# Patient Record
Sex: Female | Born: 1935 | Race: White | Hispanic: No | Smoking: Never smoker
Health system: Southern US, Community
[De-identification: ages and names within clinical notes are randomized; demographics above are authoritative.]

## PROBLEM LIST (undated history)

## (undated) DIAGNOSIS — M6289 Other specified disorders of muscle: Secondary | ICD-10-CM

## (undated) DIAGNOSIS — I10 Essential (primary) hypertension: Secondary | ICD-10-CM

## (undated) DIAGNOSIS — R339 Retention of urine, unspecified: Secondary | ICD-10-CM

## (undated) DIAGNOSIS — K219 Gastro-esophageal reflux disease without esophagitis: Secondary | ICD-10-CM

## (undated) DIAGNOSIS — L89610 Pressure ulcer of right heel, unstageable: Secondary | ICD-10-CM

## (undated) DIAGNOSIS — I2699 Other pulmonary embolism without acute cor pulmonale: Secondary | ICD-10-CM

## (undated) DIAGNOSIS — L89620 Pressure ulcer of left heel, unstageable: Secondary | ICD-10-CM

## (undated) DIAGNOSIS — F0390 Unspecified dementia without behavioral disturbance: Secondary | ICD-10-CM

## (undated) DIAGNOSIS — Z8719 Personal history of other diseases of the digestive system: Secondary | ICD-10-CM

## (undated) DIAGNOSIS — R51 Headache: Secondary | ICD-10-CM

## (undated) DIAGNOSIS — R519 Headache, unspecified: Secondary | ICD-10-CM

## (undated) DIAGNOSIS — F329 Major depressive disorder, single episode, unspecified: Secondary | ICD-10-CM

## (undated) DIAGNOSIS — F32A Depression, unspecified: Secondary | ICD-10-CM

## (undated) DIAGNOSIS — E43 Unspecified severe protein-calorie malnutrition: Secondary | ICD-10-CM

## (undated) DIAGNOSIS — F0391 Unspecified dementia with behavioral disturbance: Secondary | ICD-10-CM

## (undated) HISTORY — PX: CATARACT EXTRACTION: SUR2

---
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
## 1999-01-27 ENCOUNTER — Encounter: Payer: Self-pay | Admitting: General Surgery

## 1999-01-27 ENCOUNTER — Encounter: Payer: Self-pay | Admitting: *Deleted

## 1999-01-27 ENCOUNTER — Inpatient Hospital Stay (HOSPITAL_COMMUNITY): Admission: EM | Admit: 1999-01-27 | Discharge: 1999-02-01 | Payer: Self-pay | Admitting: *Deleted

## 2000-04-22 ENCOUNTER — Encounter: Admission: RE | Admit: 2000-04-22 | Discharge: 2000-04-22 | Payer: Self-pay | Admitting: Internal Medicine

## 2000-04-22 ENCOUNTER — Encounter: Payer: Self-pay | Admitting: Internal Medicine

## 2001-05-03 ENCOUNTER — Encounter: Admission: RE | Admit: 2001-05-03 | Discharge: 2001-05-03 | Payer: Self-pay | Admitting: Internal Medicine

## 2001-05-03 ENCOUNTER — Encounter: Payer: Self-pay | Admitting: Internal Medicine

## 2002-05-24 ENCOUNTER — Encounter: Payer: Self-pay | Admitting: Internal Medicine

## 2002-05-24 ENCOUNTER — Encounter: Admission: RE | Admit: 2002-05-24 | Discharge: 2002-05-24 | Payer: Self-pay | Admitting: Internal Medicine

## 2004-06-19 ENCOUNTER — Encounter: Admission: RE | Admit: 2004-06-19 | Discharge: 2004-06-19 | Payer: Self-pay | Admitting: Internal Medicine

## 2006-04-06 ENCOUNTER — Encounter: Admission: RE | Admit: 2006-04-06 | Discharge: 2006-04-06 | Payer: Self-pay | Admitting: Internal Medicine

## 2010-08-15 ENCOUNTER — Encounter: Payer: Self-pay | Admitting: Internal Medicine

## 2010-08-16 ENCOUNTER — Encounter: Payer: Self-pay | Admitting: Internal Medicine

## 2013-05-07 ENCOUNTER — Ambulatory Visit: Payer: Self-pay | Admitting: Podiatry

## 2013-06-04 ENCOUNTER — Ambulatory Visit (INDEPENDENT_AMBULATORY_CARE_PROVIDER_SITE_OTHER): Payer: Medicare HMO | Admitting: Podiatry

## 2013-06-04 ENCOUNTER — Encounter: Payer: Self-pay | Admitting: Podiatry

## 2013-06-04 VITALS — BP 101/86 | HR 86 | Resp 12

## 2013-06-04 DIAGNOSIS — B351 Tinea unguium: Secondary | ICD-10-CM

## 2013-06-04 DIAGNOSIS — M79609 Pain in unspecified limb: Secondary | ICD-10-CM

## 2013-06-04 NOTE — Progress Notes (Signed)
Patient ID: Elizabeth Booth, female   DOB: 1923/07/20, 79 y.o.   MRN: [id number]  Subjective: Alert orientated x3 white female presents for ongoing debridement of painful mycotic toenails and keratoses.  Objective: Yellow, brittle, hypertrophic toenails with texture and color changes x10. The nail plates are tender to palpation. Nucleated keratoses plantar second right MPJ noted.  Assessment: Symptomatic onychomycoses x10 Porokeratoses x1  Plan: Nails x10 debrided back keratoses x1 debrided back without any bleeding. Reappoint at three-month intervals.  Elizabeth Booth C.Leeanne Deed, DPM

## 2013-08-27 ENCOUNTER — Encounter: Payer: Self-pay | Admitting: Podiatry

## 2013-08-27 ENCOUNTER — Ambulatory Visit (INDEPENDENT_AMBULATORY_CARE_PROVIDER_SITE_OTHER): Payer: Medicare HMO | Admitting: Podiatry

## 2013-08-27 VITALS — BP 116/66 | HR 88 | Resp 18

## 2013-08-27 DIAGNOSIS — B351 Tinea unguium: Secondary | ICD-10-CM

## 2013-08-27 DIAGNOSIS — M79609 Pain in unspecified limb: Secondary | ICD-10-CM

## 2013-08-27 NOTE — Patient Instructions (Signed)
Removed Band-Aid on second right toe in 24 hours. If area still remains tender apply topical antibiotic ointment and Band-Aid daily until pain free.

## 2013-08-27 NOTE — Progress Notes (Signed)
° °  Subjective:    Patient ID: Elizabeth HabermannHelen S Goya, female    DOB: 03/21/1923, 80 y.o.   MRN: [id number]  HPI I am getting my toenails cut today    Review of Systems     Objective:   Physical Exam Dermatological: Elongated, hypertrophic, brittle toenails x10 noted.       Assessment & Plan:   Assessment: Symptomatic onychomycoses x10  Plan: Debridement of toenails x10 with slight pinpoint bleeding noted the second right toe. Patient advised to apply topical antibiotic ointment to the second right toe area and cover with a Band-Aid daily until healed.  Reappoint at three-month intervals or sooner if patient has concern.

## 2013-11-23 ENCOUNTER — Ambulatory Visit
Admission: RE | Admit: 2013-11-23 | Discharge: 2013-11-23 | Disposition: A | Discharge status: Home / Self Care | Payer: Commercial Managed Care - HMO | Source: Ambulatory Visit | Attending: Nurse Practitioner | Admitting: Nurse Practitioner

## 2013-11-23 ENCOUNTER — Other Ambulatory Visit: Payer: Self-pay | Admitting: Nurse Practitioner

## 2013-11-23 DIAGNOSIS — S93409A Sprain of unspecified ligament of unspecified ankle, initial encounter: Secondary | ICD-10-CM

## 2013-11-26 ENCOUNTER — Ambulatory Visit: Payer: Medicare HMO | Admitting: Podiatry

## 2013-12-24 ENCOUNTER — Ambulatory Visit (INDEPENDENT_AMBULATORY_CARE_PROVIDER_SITE_OTHER): Payer: Medicare HMO | Admitting: Podiatry

## 2013-12-24 ENCOUNTER — Encounter: Payer: Self-pay | Admitting: Podiatry

## 2013-12-24 VITALS — BP 103/64 | HR 87 | Resp 18

## 2013-12-24 DIAGNOSIS — M79609 Pain in unspecified limb: Secondary | ICD-10-CM

## 2013-12-24 DIAGNOSIS — B351 Tinea unguium: Secondary | ICD-10-CM

## 2013-12-25 NOTE — Progress Notes (Signed)
Patient ID: Elizabeth Booth, female   DOB: 1922-10-11, 80 y.o.   MRN: [id number]  Subjective: White female presents for ongoing debridement of painful toenails  Objective: Hypertrophic, brittle, elongated, discolored toenails x10  Assessment: Symptomatic onychomycoses x10  Plan: Debridement of toenails x10 without a bleeding  Reappoint x3 months

## 2014-03-25 ENCOUNTER — Ambulatory Visit (INDEPENDENT_AMBULATORY_CARE_PROVIDER_SITE_OTHER): Payer: Medicare HMO | Admitting: Podiatry

## 2014-03-25 DIAGNOSIS — M79609 Pain in unspecified limb: Secondary | ICD-10-CM

## 2014-03-25 DIAGNOSIS — M79676 Pain in unspecified toe(s): Secondary | ICD-10-CM

## 2014-03-25 DIAGNOSIS — B351 Tinea unguium: Secondary | ICD-10-CM

## 2014-03-25 NOTE — Progress Notes (Signed)
Patient ID: Elizabeth Booth, female   DOB: 15-Oct-1922, 80 y.o.   MRN: [id number]  Subjective: This patient presents complaining of painful toenails  Objective: Elongated, incurvated, brittle, discolored toenails 6-10  Assessment: Symptomatic onychomycoses 6-10  Plan: Nails x10 are debrided without any bleeding  Reappoint x3 months:

## 2014-06-24 ENCOUNTER — Ambulatory Visit: Payer: Medicare HMO | Admitting: Podiatry

## 2014-07-01 ENCOUNTER — Ambulatory Visit (INDEPENDENT_AMBULATORY_CARE_PROVIDER_SITE_OTHER): Payer: Medicare HMO | Admitting: Podiatry

## 2014-07-01 ENCOUNTER — Encounter: Payer: Self-pay | Admitting: Podiatry

## 2014-07-01 DIAGNOSIS — M79676 Pain in unspecified toe(s): Secondary | ICD-10-CM

## 2014-07-01 DIAGNOSIS — B351 Tinea unguium: Secondary | ICD-10-CM

## 2014-07-02 NOTE — Progress Notes (Signed)
Patient ID: Bridgette HabermannHelen S Worm, female   DOB: 03/12/1923, 80 y.o.   MRN: [id number]  Subjective: This patient presents complaining of painful toenails  Objective: The toenails are discolored, incurvated, brittle, 6-10  Assessment: Symptomatic onychomycoses 6-10  Plan: Debrided toenails 10 without a bleeding  Reappoint 3 months

## 2014-09-30 ENCOUNTER — Encounter: Payer: Self-pay | Admitting: Podiatry

## 2014-09-30 ENCOUNTER — Ambulatory Visit (INDEPENDENT_AMBULATORY_CARE_PROVIDER_SITE_OTHER): Payer: Commercial Managed Care - HMO | Admitting: Podiatry

## 2014-09-30 DIAGNOSIS — M79676 Pain in unspecified toe(s): Secondary | ICD-10-CM

## 2014-09-30 DIAGNOSIS — B351 Tinea unguium: Secondary | ICD-10-CM

## 2014-10-01 NOTE — Progress Notes (Signed)
Patient ID: Elizabeth HabermannHelen S Booth, female   DOB: 09/15/1922, 81 y.o.   MRN: [id number]  Subjective: Patient presents today complaining of toenails and requests debridement  Objective: The toenails are incurvated, discolored, brittle and tender to palpation 6-10  Assessment: Symptomatic onychomycoses 6-10  Plan: Debridement of toenails 10 without any bleeding  Reappoint 3 months

## 2015-01-14 ENCOUNTER — Ambulatory Visit: Payer: Commercial Managed Care - HMO | Admitting: Podiatry

## 2015-01-15 ENCOUNTER — Ambulatory Visit (INDEPENDENT_AMBULATORY_CARE_PROVIDER_SITE_OTHER): Payer: Commercial Managed Care - HMO | Admitting: Podiatry

## 2015-01-15 ENCOUNTER — Encounter: Payer: Self-pay | Admitting: Podiatry

## 2015-01-15 DIAGNOSIS — M79676 Pain in unspecified toe(s): Secondary | ICD-10-CM | POA: Diagnosis not present

## 2015-01-15 DIAGNOSIS — B351 Tinea unguium: Secondary | ICD-10-CM

## 2015-01-16 NOTE — Progress Notes (Signed)
Patient ID: Elizabeth Booth, female   DOB: Apr 23, 1923, 81 y.o.   MRN: [id number]  Subjective: Patient presents today complaining of painful toenails and requests toenail debridement  Objective: The toenails are elongated, brittle, incurvated, discolored and tender to direct palpation 6-10  Assessment:  symptomatic onychomycoses 6-10  Plan: Debridement toenails 10 without any bleeding  Reappoint 3 months

## 2015-04-16 ENCOUNTER — Encounter: Payer: Self-pay | Admitting: Podiatry

## 2015-04-16 ENCOUNTER — Ambulatory Visit (INDEPENDENT_AMBULATORY_CARE_PROVIDER_SITE_OTHER): Payer: Commercial Managed Care - HMO | Admitting: Podiatry

## 2015-04-16 DIAGNOSIS — B351 Tinea unguium: Secondary | ICD-10-CM

## 2015-04-16 DIAGNOSIS — M79676 Pain in unspecified toe(s): Secondary | ICD-10-CM | POA: Diagnosis not present

## 2015-04-16 NOTE — Progress Notes (Signed)
Patient ID: Elizabeth Booth, female   DOB: [birth year], 81 y.o.   MRN: [id number]  Subjective: This patient presents for a scheduled visit complaining of painful toenails  Objective: Orientated 3 The toenails are hypertrophic, incurvated, discolored and tender to direct palpation 6-10  Assessment: Symptomatic onychomycoses 6-10  Plan: Debridement toenails 10 and mechanically and electrically without any bleeding  Reappoint 3 months

## 2015-07-23 ENCOUNTER — Ambulatory Visit (INDEPENDENT_AMBULATORY_CARE_PROVIDER_SITE_OTHER): Payer: Commercial Managed Care - HMO | Admitting: Podiatry

## 2015-07-23 ENCOUNTER — Encounter: Payer: Self-pay | Admitting: Podiatry

## 2015-07-23 DIAGNOSIS — B351 Tinea unguium: Secondary | ICD-10-CM | POA: Diagnosis not present

## 2015-07-23 DIAGNOSIS — M79676 Pain in unspecified toe(s): Secondary | ICD-10-CM

## 2015-07-24 NOTE — Progress Notes (Signed)
Patient ID: Elizabeth HabermannHelen S Welchel, female   DOB: 01/31/1923, 81 y.o.   MRN: [id number]  Subjective: This patient presents again for a scheduled visit complaining that her her toenails are thick and elongated and are uncomfortable walking wearing shoes and she request toenail debridement  Objective: Orientated 3 No open skin lesions bilaterally The toenails are brittle, he elongated, discolored, hypertrophic and tender direct palpation 6-10  Assessment: Symptomatic onychomycoses 6-10  Plan: Debridement toenails 6-10 mechanically and electronically without any bleeding  Reappoint 3 month

## 2015-10-29 ENCOUNTER — Ambulatory Visit (INDEPENDENT_AMBULATORY_CARE_PROVIDER_SITE_OTHER): Payer: PPO | Admitting: Podiatry

## 2015-10-29 ENCOUNTER — Encounter: Payer: Self-pay | Admitting: Podiatry

## 2015-10-29 DIAGNOSIS — M79676 Pain in unspecified toe(s): Secondary | ICD-10-CM | POA: Diagnosis not present

## 2015-10-29 DIAGNOSIS — B351 Tinea unguium: Secondary | ICD-10-CM | POA: Diagnosis not present

## 2015-10-30 NOTE — Progress Notes (Signed)
Patient ID: Elizabeth HabermannHelen S Stenner, female   DOB: 12/07/1922, 82 y.o.   MRN: [id number]  Subjective: This patient presents again for a scheduled visit complaining that her her toenails are thick and elongated and are uncomfortable walking wearing shoes and she request toenail debridement  Objective: Orientated 3 No open skin lesions bilaterally The toenails are brittle, he elongated, discolored, hypertrophic and tender direct palpation 6-10  Assessment: Symptomatic onychomycoses 6-10  Plan: Debridement toenails 6-10 mechanically and electronically without any bleeding  Reappoint 3 month

## 2015-12-29 ENCOUNTER — Observation Stay (HOSPITAL_COMMUNITY)
Admission: EM | Admit: 2015-12-29 | Discharge: 2016-01-02 | Disposition: A | Discharge status: Home Health | Payer: PPO | Attending: Internal Medicine | Admitting: Internal Medicine

## 2015-12-29 ENCOUNTER — Emergency Department (HOSPITAL_COMMUNITY): Payer: PPO

## 2015-12-29 ENCOUNTER — Encounter (HOSPITAL_COMMUNITY): Payer: Self-pay | Admitting: Physician Assistant

## 2015-12-29 ENCOUNTER — Observation Stay (HOSPITAL_COMMUNITY): Payer: PPO

## 2015-12-29 DIAGNOSIS — K922 Gastrointestinal hemorrhage, unspecified: Secondary | ICD-10-CM | POA: Diagnosis not present

## 2015-12-29 DIAGNOSIS — Z7189 Other specified counseling: Secondary | ICD-10-CM | POA: Insufficient documentation

## 2015-12-29 DIAGNOSIS — I10 Essential (primary) hypertension: Secondary | ICD-10-CM | POA: Diagnosis present

## 2015-12-29 DIAGNOSIS — G309 Alzheimer's disease, unspecified: Secondary | ICD-10-CM | POA: Insufficient documentation

## 2015-12-29 DIAGNOSIS — Z880 Allergy status to penicillin: Secondary | ICD-10-CM | POA: Insufficient documentation

## 2015-12-29 DIAGNOSIS — K921 Melena: Principal | ICD-10-CM | POA: Diagnosis present

## 2015-12-29 DIAGNOSIS — N179 Acute kidney failure, unspecified: Secondary | ICD-10-CM | POA: Diagnosis not present

## 2015-12-29 DIAGNOSIS — M25473 Effusion, unspecified ankle: Secondary | ICD-10-CM

## 2015-12-29 DIAGNOSIS — F0391 Unspecified dementia with behavioral disturbance: Secondary | ICD-10-CM | POA: Diagnosis not present

## 2015-12-29 DIAGNOSIS — M25461 Effusion, right knee: Secondary | ICD-10-CM | POA: Diagnosis not present

## 2015-12-29 DIAGNOSIS — G4734 Idiopathic sleep related nonobstructive alveolar hypoventilation: Secondary | ICD-10-CM

## 2015-12-29 DIAGNOSIS — F0280 Dementia in other diseases classified elsewhere without behavioral disturbance: Secondary | ICD-10-CM | POA: Insufficient documentation

## 2015-12-29 DIAGNOSIS — E875 Hyperkalemia: Secondary | ICD-10-CM | POA: Diagnosis not present

## 2015-12-29 DIAGNOSIS — F0390 Unspecified dementia without behavioral disturbance: Secondary | ICD-10-CM | POA: Insufficient documentation

## 2015-12-29 DIAGNOSIS — R0902 Hypoxemia: Secondary | ICD-10-CM | POA: Diagnosis not present

## 2015-12-29 DIAGNOSIS — E871 Hypo-osmolality and hyponatremia: Secondary | ICD-10-CM | POA: Insufficient documentation

## 2015-12-29 DIAGNOSIS — E860 Dehydration: Secondary | ICD-10-CM | POA: Diagnosis not present

## 2015-12-29 DIAGNOSIS — Z515 Encounter for palliative care: Secondary | ICD-10-CM | POA: Insufficient documentation

## 2015-12-29 DIAGNOSIS — Z66 Do not resuscitate: Secondary | ICD-10-CM | POA: Diagnosis not present

## 2015-12-29 HISTORY — DX: Essential (primary) hypertension: I10

## 2015-12-29 HISTORY — DX: Unspecified dementia, unspecified severity, without behavioral disturbance, psychotic disturbance, mood disturbance, and anxiety: F03.90

## 2015-12-29 LAB — ABO/RH: ABO/RH(D): O POS

## 2015-12-29 LAB — I-STAT CHEM 8, ED
BUN: 34 mg/dL — ABNORMAL HIGH (ref 6–20)
CALCIUM ION: 1.07 mmol/L — AB (ref 1.13–1.30)
CREATININE: 1 mg/dL (ref 0.44–1.00)
Chloride: 89 mmol/L — ABNORMAL LOW (ref 101–111)
GLUCOSE: 143 mg/dL — AB (ref 65–99)
HEMATOCRIT: 39 % (ref 36.0–46.0)
HEMOGLOBIN: 13.3 g/dL (ref 12.0–15.0)
Potassium: 4.6 mmol/L (ref 3.5–5.1)
Sodium: 129 mmol/L — ABNORMAL LOW (ref 135–145)
TCO2: 32 mmol/L (ref 0–100)

## 2015-12-29 LAB — CBC WITH DIFFERENTIAL/PLATELET
BASOS ABS: 0 10*3/uL (ref 0.0–0.1)
Basophils Relative: 0 %
Eosinophils Absolute: 0.1 10*3/uL (ref 0.0–0.7)
Eosinophils Relative: 1 %
HCT: 38.3 % (ref 36.0–46.0)
HEMOGLOBIN: 12.2 g/dL (ref 12.0–15.0)
LYMPHS PCT: 5 %
Lymphs Abs: 0.6 10*3/uL — ABNORMAL LOW (ref 0.7–4.0)
MCH: 29.6 pg (ref 26.0–34.0)
MCHC: 31.9 g/dL (ref 30.0–36.0)
MCV: 93 fL (ref 78.0–100.0)
Monocytes Absolute: 1.4 10*3/uL — ABNORMAL HIGH (ref 0.1–1.0)
Monocytes Relative: 12 %
NEUTROS PCT: 82 %
Neutro Abs: 9.4 10*3/uL — ABNORMAL HIGH (ref 1.7–7.7)
Platelets: 207 10*3/uL (ref 150–400)
RBC: 4.12 MIL/uL (ref 3.87–5.11)
RDW: 13.1 % (ref 11.5–15.5)
WBC: 11.6 10*3/uL — AB (ref 4.0–10.5)

## 2015-12-29 LAB — COMPREHENSIVE METABOLIC PANEL
ALT: 21 U/L (ref 14–54)
ANION GAP: 7 (ref 5–15)
AST: 32 U/L (ref 15–41)
Albumin: 2.9 g/dL — ABNORMAL LOW (ref 3.5–5.0)
Alkaline Phosphatase: 64 U/L (ref 38–126)
BUN: 32 mg/dL — ABNORMAL HIGH (ref 6–20)
CHLORIDE: 93 mmol/L — AB (ref 101–111)
CO2: 28 mmol/L (ref 22–32)
Calcium: 8.3 mg/dL — ABNORMAL LOW (ref 8.9–10.3)
Creatinine, Ser: 1.06 mg/dL — ABNORMAL HIGH (ref 0.44–1.00)
GFR, EST AFRICAN AMERICAN: 52 mL/min — AB (ref >60)
GFR, EST NON AFRICAN AMERICAN: 44 mL/min — AB (ref >60)
Glucose, Bld: 146 mg/dL — ABNORMAL HIGH (ref 65–99)
POTASSIUM: 4.6 mmol/L (ref 3.5–5.1)
Sodium: 128 mmol/L — ABNORMAL LOW (ref 135–145)
Total Bilirubin: 1.5 mg/dL — ABNORMAL HIGH (ref 0.3–1.2)
Total Protein: 6.3 g/dL — ABNORMAL LOW (ref 6.5–8.1)

## 2015-12-29 LAB — POC OCCULT BLOOD, ED
FECAL OCCULT BLD: NEGATIVE
Fecal Occult Bld: POSITIVE — AB

## 2015-12-29 LAB — URINALYSIS, ROUTINE W REFLEX MICROSCOPIC
Bilirubin Urine: NEGATIVE
GLUCOSE, UA: NEGATIVE mg/dL
HGB URINE DIPSTICK: NEGATIVE
Ketones, ur: 15 mg/dL — AB
Leukocytes, UA: NEGATIVE
Nitrite: NEGATIVE
Protein, ur: NEGATIVE mg/dL
SPECIFIC GRAVITY, URINE: 1.022 (ref 1.005–1.030)
pH: 6.5 (ref 5.0–8.0)

## 2015-12-29 LAB — CBG MONITORING, ED: Glucose-Capillary: 160 mg/dL — ABNORMAL HIGH (ref 65–99)

## 2015-12-29 LAB — TYPE AND SCREEN
ABO/RH(D): O POS
Antibody Screen: NEGATIVE

## 2015-12-29 LAB — PROTIME-INR
INR: 1.29 (ref 0.00–1.49)
Prothrombin Time: 16.2 seconds — ABNORMAL HIGH (ref 11.6–15.2)

## 2015-12-29 LAB — APTT: APTT: 31 s (ref 24–37)

## 2015-12-29 MED ORDER — SODIUM CHLORIDE 0.9 % IV SOLN
INTRAVENOUS | Status: DC
  Administered 2015-12-29 – 2016-01-01 (×2): via INTRAVENOUS

## 2015-12-29 MED ORDER — ONDANSETRON HCL 4 MG/2ML IJ SOLN
4.0000 mg | Freq: Once | INTRAMUSCULAR | Status: AC
  Administered 2015-12-29: 4 mg via INTRAVENOUS
  Filled 2015-12-29: qty 2

## 2015-12-29 MED ORDER — LOSARTAN POTASSIUM 50 MG PO TABS
50.0000 mg | ORAL_TABLET | Freq: Every day | ORAL | Status: DC
  Administered 2015-12-30 – 2015-12-31 (×2): 50 mg via ORAL
  Filled 2015-12-29 (×3): qty 1

## 2015-12-29 MED ORDER — SODIUM CHLORIDE 0.9 % IV SOLN
8.0000 mg/h | INTRAVENOUS | Status: DC
  Administered 2015-12-29 – 2016-01-01 (×5): 8 mg/h via INTRAVENOUS
  Filled 2015-12-29 (×7): qty 80

## 2015-12-29 MED ORDER — SODIUM CHLORIDE 0.9% FLUSH
3.0000 mL | Freq: Two times a day (BID) | INTRAVENOUS | Status: DC
  Administered 2015-12-30 – 2016-01-02 (×4): 3 mL via INTRAVENOUS

## 2015-12-29 MED ORDER — HALOPERIDOL LACTATE 5 MG/ML IJ SOLN
5.0000 mg | Freq: Four times a day (QID) | INTRAMUSCULAR | Status: DC | PRN
  Administered 2015-12-29: 5 mg via INTRAMUSCULAR
  Filled 2015-12-29: qty 1

## 2015-12-29 MED ORDER — SODIUM CHLORIDE 0.9 % IV SOLN
80.0000 mg | Freq: Once | INTRAVENOUS | Status: AC
  Administered 2015-12-29: 80 mg via INTRAVENOUS
  Filled 2015-12-29: qty 80

## 2015-12-29 MED ORDER — ACETAMINOPHEN 325 MG PO TABS
650.0000 mg | ORAL_TABLET | Freq: Four times a day (QID) | ORAL | Status: DC | PRN
  Filled 2015-12-29: qty 2

## 2015-12-29 MED ORDER — LORAZEPAM 2 MG/ML IJ SOLN
0.5000 mg | Freq: Once | INTRAMUSCULAR | Status: AC
  Administered 2015-12-29: 0.5 mg via INTRAVENOUS
  Filled 2015-12-29: qty 1

## 2015-12-29 MED ORDER — MORPHINE SULFATE (PF) 2 MG/ML IV SOLN: 1.0000 mg | INTRAVENOUS | Status: DC | PRN

## 2015-12-29 MED ORDER — ONDANSETRON HCL 4 MG/2ML IJ SOLN: 4.0000 mg | Freq: Four times a day (QID) | INTRAMUSCULAR | Status: DC | PRN

## 2015-12-29 MED ORDER — LORAZEPAM 2 MG/ML IJ SOLN: 0.5000 mg | Freq: Four times a day (QID) | INTRAMUSCULAR | Status: DC | PRN

## 2015-12-29 MED ORDER — ACETAMINOPHEN 650 MG RE SUPP: 650.0000 mg | Freq: Four times a day (QID) | RECTAL | Status: DC | PRN

## 2015-12-29 MED ORDER — HYDROCODONE-ACETAMINOPHEN 5-325 MG PO TABS
1.0000 | ORAL_TABLET | ORAL | Status: DC | PRN
  Administered 2015-12-29: 2 via ORAL
  Filled 2015-12-29: qty 2

## 2015-12-29 MED ORDER — ACETAMINOPHEN 325 MG PO TABS
650.0000 mg | ORAL_TABLET | Freq: Once | ORAL | Status: AC
  Administered 2015-12-29: 650 mg via ORAL
  Filled 2015-12-29: qty 2

## 2015-12-29 MED ORDER — ONDANSETRON HCL 4 MG PO TABS: 4.0000 mg | ORAL_TABLET | Freq: Four times a day (QID) | ORAL | Status: DC | PRN

## 2015-12-29 MED ORDER — TRAZODONE HCL 50 MG PO TABS: 25.0000 mg | ORAL_TABLET | Freq: Every evening | ORAL | Status: DC | PRN

## 2015-12-29 NOTE — H&P (Signed)
History and Physical    Elizabeth Booth UEA:[id number] DOB: 06/27/1923 DOA: 12/29/2015   PCP: [name], MD   Patient coming from:  Home  Chief Complaint: Melanotic stools  HPI: Elizabeth Booth is a 82 y.o. female with medical history significant for HTN, dementia, presenting to the ED with acute melena. She was noted to have large melanotic stools 3 days ago, without any prior similar events. Since then, she has had increasing frequency of these stools, more liqquid, as as per daughter's report, unable to make it to the bathroom, soiling herself. On admission, she was going having about 7-8 stools a day. On admission, one event was witnessed by EDP, confirming melanotic stools. No hematochezia was noted. She did take MOM 1 week prior, and Imodium at home buy her daughter. Daughter denies any nausea or vomiting, no  chills, night sweats, or  respiratory complaints.No aparent chest pain or palpitations. SHe had a mechanical fall on her right hip and knee for which she was taking significant amount of NSAIDS.  Denies abdominal pain. Appetite is normal. No other bleeding issues such as epistaxis, hematemesis, hematuria . No new changes in meds. No recent trips or infections.  She may have had a remote coolonoscopy that was normal at the time. No family history of GI cancers. No ETOH. GI consult is pending  ED Course:  BP 139/53 mmHg  Pulse 79  Temp(Src) 98.4 F (36.9 C) (Oral)  Resp 17  SpO2 97% WBC 11.6, Na 129   Review of Systems: As per HPI otherwise 10 point review of systems negative.   Past Medical History  Diagnosis Date  . Hypertension     No past surgical history on file.  Social History  reports that she has never smoked. She does not have any smokeless tobacco history on file. She reports that she does not drink alcohol or use illicit drugs.  Walks unassisted  with cane  with walker Patient is on  wheelchair  Allergies  Allergen Reactions  . Penicillins Anaphylaxis      No family history on file.    Prior to Admission medications   Medication Sig Start Date End Date Taking? Authorizing Provider  FLUVIRIN INJ injection  05/14/13   Historical Provider, MD  losartan (COZAAR) 50 MG tablet  05/13/13   Historical Provider, MD    Physical Exam:    Filed Vitals:   12/29/15 1403 12/29/15 1415 12/29/15 1430 12/29/15 1445  BP: 139/53     Pulse: 54 79 82 79  Temp:      TempSrc:      Resp: 14 18 23 17   SpO2: 96% 91% 100% 97%      Constitutional: NAD, calm, comfortable Filed Vitals:   12/29/15 1403 12/29/15 1415 12/29/15 1430 12/29/15 1445  BP: 139/53     Pulse: 54 79 82 79  Temp:      TempSrc:      Resp: 14 18 23 17   SpO2: 96% 91% 100% 97%   Eyes: PERRL, lids and conjunctivae normal ENMT: Mucous membranes are moist. Posterior pharynx clear of any exudate or lesions.Normal dentition.  Neck: normal, supple, no masses, no thyromegaly Respiratory: clear to auscultation bilaterally, no wheezing, no crackles. Normal respiratory effort. No accessory muscle use.  Cardiovascular: Regular rate and rhythm, no murmurs / rubs / gallops. No extremity edema. 2+ pedal pulses. No carotid bruits.  Abdomen:  Somewhat firm on the L quadrants, no tenderness No hepatosplenomegaly. Bowel sounds positive.  Musculoskeletal: no  clubbing / cyanosis. No joint deformity upper and lower extremities. Good ROM, no contractures. Normal muscle tone.  RLE effusion post fall, mildly tender to palpation Skin: no rashes, lesions, ulcers. Some bruising on the right hip.  Neurologic: Follows very simple commands in the setting of dementia.     Labs on Admission: I have personally reviewed following labs and imaging studies  CBC:  Recent Labs Lab 12/29/15 1141 12/29/15 1154  WBC 11.6*  --   NEUTROABS 9.4*  --   HGB 12.2 13.3  HCT 38.3 39.0  MCV 93.0  --   PLT 207  --     Basic Metabolic Panel:  Recent Labs Lab 12/29/15 1141 12/29/15 1154  NA 128* 129*  K 4.6  4.6  CL 93* 89*  CO2 28  --   GLUCOSE 146* 143*  BUN 32* 34*  CREATININE 1.06* 1.00  CALCIUM 8.3*  --     GFR: CrCl cannot be calculated (Unknown ideal weight.).  Liver Function Tests:  Recent Labs Lab 12/29/15 1141  AST 32  ALT 21  ALKPHOS 64  BILITOT 1.5*  PROT 6.3*  ALBUMIN 2.9*   No results for input(s): LIPASE, AMYLASE in the last 168 hours. No results for input(s): AMMONIA in the last 168 hours.  Coagulation Profile:  Recent Labs Lab 12/29/15 1141  INR 1.29    Cardiac Enzymes: No results for input(s): CKTOTAL, CKMB, CKMBINDEX, TROPONINI in the last 168 hours.  BNP (last 3 results) No results for input(s): PROBNP in the last 8760 hours.  HbA1C: No results for input(s): HGBA1C in the last 72 hours.  CBG:  Recent Labs Lab 12/29/15 1057  GLUCAP 160*    Lipid Profile: No results for input(s): CHOL, HDL, LDLCALC, TRIG, CHOLHDL, LDLDIRECT in the last 72 hours.  Thyroid Function Tests: No results for input(s): TSH, T4TOTAL, FREET4, T3FREE, THYROIDAB in the last 72 hours.  Anemia Panel: No results for input(s): VITAMINB12, FOLATE, FERRITIN, TIBC, IRON, RETICCTPCT in the last 72 hours.  Urine analysis:    Component Value Date/Time   COLORURINE AMBER* 12/29/2015 1136   APPEARANCEUR CLEAR 12/29/2015 1136   LABSPEC 1.022 12/29/2015 1136   PHURINE 6.5 12/29/2015 1136   GLUCOSEU NEGATIVE 12/29/2015 1136   HGBUR NEGATIVE 12/29/2015 1136   BILIRUBINUR NEGATIVE 12/29/2015 1136   KETONESUR 15* 12/29/2015 1136   PROTEINUR NEGATIVE 12/29/2015 1136   NITRITE NEGATIVE 12/29/2015 1136   LEUKOCYTESUR NEGATIVE 12/29/2015 1136    Sepsis Labs: @LABRCNTIP (procalcitonin:4,lacticidven:4) )No results found for this or any previous visit (from the past 240 hour(s)).   Radiological Exams on Admission: Dg Knee Complete 4 Views Right  12/29/2015  CLINICAL DATA:  Fall, confusion.  Right leg pain. EXAM: RIGHT KNEE - COMPLETE 4+ VIEW COMPARISON:  None. FINDINGS: There  is chondrocalcinosis and moderate degenerative changes throughout the right knee. Moderate joint effusion. No acute bony abnormality. Specifically, no fracture, subluxation, or dislocation. Soft tissues are intact. IMPRESSION: Moderate degenerative changes with chondrocalcinosis. Moderate joint effusion. No acute bony abnormality. Electronically Signed   By: Charlett NoseKevin  Dover M.D.   On: 12/29/2015 13:34   Dg Hip Unilat With Pelvis 2-3 Views Right  12/29/2015  CLINICAL DATA:  Status post fall.  Right leg pain. EXAM: DG HIP (WITH OR WITHOUT PELVIS) 2-3V RIGHT COMPARISON:  None. FINDINGS: No acute fracture or dislocation. No lytic or sclerotic osseous lesion. Mild osteoarthritis of the right hip. Mild degenerative changes of the SI joints and lower lumbar spine. IMPRESSION: No acute osseous injury of the  right hip. Electronically Signed   By: Elige Ko   On: 12/29/2015 13:35    EKG: Independently reviewed.  Assessment/Plan Active Problems:   Melena   Hypertension   GI bleed   Gastro Intestinal Bleed, melanotic stools, frequent. Patient with recent history of increased NSAID for knee pain after mechanical fall  No history of GIB in the past.  No ETOH Hb 13. BUN 34, sugg of Upper GIB . Received IV protonix in the ED Admit to tele obs  GI evaluation is pending Will type and screen, check serial CBCs, and transfuse for Hgb less than 8.   IV protonix as per GI IVF NPO  Avoid NSAIDs   Intermittent Hypoxia while sleeping, O2 sats while awake is normal.  Chest x ray to rule out any malignancy, OSA, COPD O2 sats close monitoring  Hypertension BP 139/53 mmHg  Controlled with home antihypertensives, continue present therapy   Hyponatremia This is likely due volume loss, no increased confusion.  - hold HCTZ - IV Normal saline Repeat  CMET in am  Recent mechanical fall in a patient with dementia, negative XR right knee and hip for fracture. + right knee effusion.  Consult PT/OT consultation    Dementia, not on meds   DVT prophylaxis: SCDs for now due to GIB Code Status:   Full     Family Communication:  Discussed with daughter Disposition Plan: Expect patient to be discharged to home after condition improves Consults called:    None Admission status:Tele  Obs    Latise Dilley E, PA-C [facility]   If 7PM-7AM, please contact night-coverage [email or web address] Password [email or web address]  12/29/2015, 3:27 PM

## 2015-12-29 NOTE — ED Notes (Signed)
Attempted Report x1.   

## 2015-12-29 NOTE — ED Notes (Signed)
Pt continues to try to get out of bed and has removed IV.  PIV restarted and secured.

## 2015-12-29 NOTE — ED Notes (Signed)
Attempted report 

## 2015-12-29 NOTE — ED Notes (Signed)
Pt very agitated asking "why am I here?". Pt trying to get out of bed. Informed Dr. Patria Maneampos and Joni ReiningNicole - PA.

## 2015-12-29 NOTE — ED Notes (Signed)
[facility]

## 2015-12-29 NOTE — ED Notes (Signed)
Pt being transported upstairs by Kenney Housemananya, EMT

## 2015-12-29 NOTE — ED Notes (Signed)
Pt returned from Xray. PA Joni ReiningNicole and [place] completed POC Occult

## 2015-12-29 NOTE — Consult Note (Signed)
Subjective:   HPI  The patient is a 82 year old female with a history of dementia. She has other multiple medical problems. She was brought to the emergency room by her daughter and subsequently admitted and we are asked to see her in consultation. According to the daughter the patient started having diarrhea a few days ago she then started taking Pepto-Bismol as well as an antidiarrheal agent. Her stools turned black. In the emergency room her stool was found to be heme-positive.  Review of Systems No complaints of chest pain or shortness of breath  Past Medical History  Diagnosis Date  . Hypertension   . Dementia    No past surgical history on file. Social History   Social History  . Marital Status: Widowed    Spouse Name: N/A  . Number of Children: N/A  . Years of Education: N/A   Occupational History  . Not on file.   Social History Main Topics  . Smoking status: Never Smoker   . Smokeless tobacco: Not on file  . Alcohol Use: No  . Drug Use: No  . Sexual Activity: Not on file   Other Topics Concern  . Not on file   Social History Narrative   family history is not on file.  Current facility-administered medications:  .  0.9 %  sodium chloride infusion, , Intravenous, Continuous, Marcos Eke, PA-C, Last Rate: 75 mL/hr at 12/29/15 1637 .  acetaminophen (TYLENOL) tablet 650 mg, 650 mg, Oral, Q6H PRN **OR** acetaminophen (TYLENOL) suppository 650 mg, 650 mg, Rectal, Q6H PRN, Marcos Eke, PA-C .  haloperidol lactate (HALDOL) injection 5 mg, 5 mg, Intramuscular, Q6H PRN, Ozella Rocks, MD, 5 mg at 12/29/15 1714 .  HYDROcodone-acetaminophen (NORCO/VICODIN) 5-325 MG per tablet 1-2 tablet, 1-2 tablet, Oral, Q4H PRN, Marcos Eke, PA-C, 2 tablet at 12/29/15 1620 .  LORazepam (ATIVAN) injection 0.5 mg, 0.5 mg, Intravenous, Q6H PRN, Ozella Rocks, MD .  LORazepam (ATIVAN) injection 0.5 mg, 0.5 mg, Intravenous, Once, Ozella Rocks, MD .  losartan (COZAAR) tablet 50  mg, 50 mg, Oral, Daily, Marcos Eke, PA-C, 50 mg at 12/29/15 1705 .  morphine 2 MG/ML injection 1 mg, 1 mg, Intravenous, Q4H PRN, Marcos Eke, PA-C .  ondansetron (ZOFRAN) tablet 4 mg, 4 mg, Oral, Q6H PRN **OR** ondansetron (ZOFRAN) injection 4 mg, 4 mg, Intravenous, Q6H PRN, Marcos Eke, PA-C .  pantoprazole (PROTONIX) 80 mg in sodium chloride 0.9 % 250 mL (0.32 mg/mL) infusion, 8 mg/hr, Intravenous, Continuous, Nicole Pisciotta, PA-C, Last Rate: 25 mL/hr at 12/29/15 1448, 8 mg/hr at 12/29/15 1448 .  sodium chloride flush (NS) 0.9 % injection 3 mL, 3 mL, Intravenous, Q12H, Sung Amabile Wertman, PA-C, 3 mL at 12/29/15 1624 .  traZODone (DESYREL) tablet 25 mg, 25 mg, Oral, QHS PRN, Marcos Eke, PA-C Allergies  Allergen Reactions  . Penicillins Anaphylaxis     Objective:     BP 92/47 mmHg  Pulse 75  Temp(Src) 97.8 F (36.6 C) (Oral)  Resp 16  Ht  (1.575 m)  Wt 58.196 kg (128 lb 4.8 oz)  BMI 23.46 kg/m2  SpO2 95%  She is in no acute distress  Nonicteric  Heart regular rhythm no murmurs  Lungs clear  Abdomen: Bowel sounds present, soft, nontender    Laboratory No components found for: D1    Assessment:     Melena versus pseudo-melena from Pepto-Bismol  History of diarrhea recently      Plan:  I would recommend observation and supportive care at this time. I talked to the patient's daughter about further evaluation which could perhaps include endoscopy but given the patient's overall clinical situation with dementia the daughter did not wish for her to have any invasive procedures unless absolutely necessary. Therefore we will follow clinically. Lab Results  Component Value Date   HGB 13.3 12/29/2015   HGB 12.2 12/29/2015   HCT 39.0 12/29/2015   HCT 38.3 12/29/2015   ALKPHOS 64 12/29/2015   AST 32 12/29/2015   ALT 21 12/29/2015

## 2015-12-29 NOTE — ED Provider Notes (Signed)
CSN: [id number]     Arrival date & time 12/29/15  1044 History   First MD Initiated Contact with Patient 12/29/15 1047     Chief Complaint  Patient presents with  . Diarrhea     (Consider location/radiation/quality/duration/timing/severity/associated sxs/prior Treatment) HPI   Blood pressure 127/55, pulse 87, temperature 98.4 F (36.9 C), temperature source Oral, resp. rate 16, SpO2 93 %.  Elizabeth HabermannHelen S Stanaland is a 82 y.o. female brought in by EMS for diarrhea, as per EMS patient had melanotic stool. As per patient she had diarrhea onset yesterday. When asked what she dialed 911 and she states I can't remember. Level V caveat secondary to likely dementia. Patient denies chest pain, shortness of breath, abdominal pain. Chart review does not show anticoagulation.   Daughter states that she has been having multiple episodes of large-volume diarrhea onset 3 days ago, states it is so frequent that she is not even trying to get to the bathroom anymore, she denies anticoagulant, history of GI bleed, nausea, vomiting. Patient also had a mechanical fall a week ago with bruising to the right thigh. Patient takes Anacin for aches and pains, as per her daughter she doesn't know exactly how much she takes daily.  As per her daughter, patient is full code.   No past medical history on file. No past surgical history on file. No family history on file. Social History  Substance Use Topics  . Smoking status: Never Smoker   . Smokeless tobacco: Not on file  . Alcohol Use: No   OB History    No data available     Review of Systems  10 systems reviewed and found to be negative, except as noted in the HPI.   Allergies  Penicillins  Home Medications   Prior to Admission medications   Medication Sig Start Date End Date Taking? Authorizing Provider  FLUVIRIN INJ injection  05/14/13   Historical Provider, MD  losartan (COZAAR) 50 MG tablet  05/13/13   Historical Provider, MD   BP 127/55 mmHg   Pulse 87  Temp(Src) 98.4 F (36.9 C) (Oral)  Resp 16  SpO2 93% Physical Exam  Constitutional: She appears well-developed and well-nourished. No distress.  HENT:  Head: Normocephalic.  Mouth/Throat: Oropharynx is clear and moist.  Eyes: Conjunctivae and EOM are normal. Pupils are equal, round, and reactive to light.  Cardiovascular: Normal rate, regular rhythm and intact distal pulses.   Pulmonary/Chest: Effort normal and breath sounds normal. No stridor. No respiratory distress. She has no wheezes. She has no rales. She exhibits no tenderness.  Abdominal: Bowel sounds are normal. She exhibits no distension and no mass. There is no tenderness. There is no guarding.  Genitourinary: Guaiac positive stool.  Incontinent to grossly melanotic stool, patient has melanotic stool staining the rectum and lower extremities. .   Musculoskeletal: Normal range of motion.  Right lower extremity with no shortening or rotation. No tenderness to palpation over the right greater trochanter, there is a large ecchymosis on the medial aspect of the right thigh, right knee with significant effusion and tenderness palpation, reduced range of motion. No warmth or overlying skin changes.  Neurological: She is alert.  Oriented to self only  Psychiatric: She has a normal mood and affect.  Nursing note and vitals reviewed.   ED Course  Procedures (including critical care time) Labs Review Labs Reviewed  CBG MONITORING, ED - Abnormal; Notable for the following:    Glucose-Capillary 160 (*)    All other components  within normal limits    Imaging Review No results found. I have personally reviewed and evaluated these images and lab results as part of my medical decision-making.   EKG Interpretation None      MDM   Final diagnoses:  UGI bleed    Filed Vitals:   12/29/15 1130 12/29/15 1200 12/29/15 1230 12/29/15 1402  BP: 134/54 104/66 109/55 139/53  Pulse: 82 84 80 72  Temp:      TempSrc:       Resp: SpO2: 98% 97% 93% 96%    Medications  pantoprazole (PROTONIX) 80 mg in sodium chloride 0.9 % 250 mL (0.32 mg/mL) infusion (8 mg/hr Intravenous New Bag/Given 12/29/15 1448)  ondansetron (ZOFRAN) injection 4 mg (4 mg Intravenous Given 12/29/15 1158)  pantoprazole (PROTONIX) 80 mg in sodium chloride 0.9 % 100 mL IVPB (0 mg Intravenous Stopped 12/29/15 1222)  acetaminophen (TYLENOL) tablet 650 mg (650 mg Oral Given 12/29/15 1448)    Elizabeth Booth is 82 y.o. female presenting with Diarrhea, grossly melanotic stool on exam. Patient is demented. Stable vital signs. Patient's guaiac has resulted as negative, think this is an error. As per daughter she did have some Pepto-Bismol yesterday, 2 days after the onset of her melena. I have obtained a fecal occult blood and have watched it being developed, this was positive. BUN mildly elevated at 32, no significant anemia with a normal hemoglobin and hematocrit, normal platelet count is well. Patient will need admission for serial CBC.  This is a shared visit with the attending physician who personally evaluated the patient and agrees with the care plan.   Discussed with [facility], they will evaluate her on the floor.    Wynetta Emery, PA-C 12/29/15 1511  Azalia Bilis, MD 12/29/15 [phone]

## 2015-12-29 NOTE — ED Notes (Signed)
PA at the bedside.

## 2015-12-30 DIAGNOSIS — K922 Gastrointestinal hemorrhage, unspecified: Secondary | ICD-10-CM | POA: Diagnosis not present

## 2015-12-30 LAB — CBC
HEMATOCRIT: 36 % (ref 36.0–46.0)
Hemoglobin: 11 g/dL — ABNORMAL LOW (ref 12.0–15.0)
MCH: 29.5 pg (ref 26.0–34.0)
MCHC: 30.6 g/dL (ref 30.0–36.0)
MCV: 96.5 fL (ref 78.0–100.0)
Platelets: 210 10*3/uL (ref 150–400)
RBC: 3.73 MIL/uL — ABNORMAL LOW (ref 3.87–5.11)
RDW: 13.5 % (ref 11.5–15.5)
WBC: 11.3 10*3/uL — AB (ref 4.0–10.5)

## 2015-12-30 LAB — COMPREHENSIVE METABOLIC PANEL
ALT: 17 U/L (ref 14–54)
ANION GAP: 8 (ref 5–15)
AST: 25 U/L (ref 15–41)
Albumin: 2.5 g/dL — ABNORMAL LOW (ref 3.5–5.0)
Alkaline Phosphatase: 61 U/L (ref 38–126)
BILIRUBIN TOTAL: 1.5 mg/dL — AB (ref 0.3–1.2)
BUN: 30 mg/dL — AB (ref 6–20)
CO2: 29 mmol/L (ref 22–32)
Calcium: 8.1 mg/dL — ABNORMAL LOW (ref 8.9–10.3)
Chloride: 97 mmol/L — ABNORMAL LOW (ref 101–111)
Creatinine, Ser: 1.25 mg/dL — ABNORMAL HIGH (ref 0.44–1.00)
GFR calc Af Amer: 42 mL/min — ABNORMAL LOW (ref >60)
GFR, EST NON AFRICAN AMERICAN: 36 mL/min — AB (ref >60)
Glucose, Bld: 103 mg/dL — ABNORMAL HIGH (ref 65–99)
POTASSIUM: 5.2 mmol/L — AB (ref 3.5–5.1)
Sodium: 134 mmol/L — ABNORMAL LOW (ref 135–145)
TOTAL PROTEIN: 5.7 g/dL — AB (ref 6.5–8.1)

## 2015-12-30 LAB — PROTIME-INR
INR: 1.24 (ref 0.00–1.49)
Prothrombin Time: 15.8 seconds — ABNORMAL HIGH (ref 11.6–15.2)

## 2015-12-30 MED ORDER — NALOXONE HCL 0.4 MG/ML IJ SOLN
INTRAMUSCULAR | Status: AC
  Administered 2015-12-30: 0.4 mg via INTRAVENOUS
  Filled 2015-12-30: qty 1

## 2015-12-30 MED ORDER — HYDROCODONE-ACETAMINOPHEN 5-325 MG PO TABS: 1.0000 | ORAL_TABLET | Freq: Four times a day (QID) | ORAL | Status: DC | PRN

## 2015-12-30 MED ORDER — NALOXONE HCL 0.4 MG/ML IJ SOLN: 0.4000 mg | Freq: Once | INTRAMUSCULAR | Status: AC

## 2015-12-30 NOTE — Care Management Note (Signed)
Case Management Note  Patient Details  Name: Elizabeth HabermannHelen S Booth MRN: [id number] Date of Birth: 01/14/1923  Subjective/Objective:           82 y.o. female with medical history significant for HTN, dementia, presents with acute melena. Resides with daughter. PCP: Kirby FunkJohn Griffin.   Action/Plan: Plan is to d/c pt to home with home health services with daughter when medically stable. CM to f/u with disposition needs  Expected Discharge Date:                  Expected Discharge Plan:  Home with home health services  In-House Referral:     Discharge planning Services  CM Consult  Post Acute Care Choice:    Choice offered to:   daughter  DME Arranged:    DME Agency:     HH Arranged:    HH Agency:   [facility] selected per daughter for home health services.  Status of Service:  In process, will continue to follow  Medicare Important Message Given:    Date Medicare IM Given:    Medicare IM give by:    Date Additional Medicare IM Given:    Additional Medicare Important Message give by:     If discussed at Long Length of Stay Meetings, dates discussed:    Additional Comments:  Epifanio LeschesCole, Daronte Shostak Hudson, [place]RN,BSN,CM [phone] 12/30/2015, 1:10 PM

## 2015-12-30 NOTE — Progress Notes (Signed)
PROGRESS NOTE  Elizabeth HabermannHelen S Booth ZOX:[id number] DOB: 11/24/1922 DOA: 12/29/2015 PCP: Lillia MountainGRIFFIN,JOHN JOSEPH, MD     Brief Narrative: 82 y.o. female with medical history significant for HTN, dementia, presenting to the ED with acute melena. She was noted to have large melanotic stools 3 days ago, without any prior similar events. Since then, she has had increasing frequency of these stools, more liqquid, as as per daughter's report, unable to make it to the bathroom, soiling herself. GI consulted on admission  Assessment & Plan: Active Problems:   Melena   Hypertension   GI bleed   GI Bleed / melena  - Patient with recent history of increased NSAID for knee pain after mechanical fall No history of GIB in the past. No ETOH  - Hb overall stable 12.2 >> 13.3 >> 11.0 - continue protonix - appreciate GI input - Avoid NSAIDs  Intermittent Hypoxia while sleeping, O2 sats while awake is normal.  - Chest x ray negative  Hypertension BP 139/53 mmHg  - Controlled with home antihypertensives, continue present therapy   Hyponatremia  - due to dehydration, improving with fluids - hold HCTZ - stop fluids 6/7  Recent mechanical fall in a patient with dementia,  - negative XR right knee and hip for fracture. + right knee effusion.  - Consult PT/OT consultation   Dementia, not on meds   DVT prophylaxis: SCD Code Status: Full Family Communication: no family bedside Disposition Plan: TBD  Consultants:   GI  Procedures:   None   Antimicrobials:  None    Subjective: - NAD, very confused, no complaints  Objective: Filed Vitals:   12/29/15 2051 12/29/15 2103 12/30/15 0400 12/30/15 1030  BP:   153/59 149/65  Pulse: 87  89 86  Temp:   98.3 F (36.8 C)   TempSrc:   Axillary   Resp: 20  17   Height:      Weight:      SpO2: 90% 96% 94%     Intake/Output Summary (Last 24 hours) at 12/30/15 1203 Last data filed at 12/30/15 0746  Gross per 24 hour  Intake   1200 ml  Output       0 ml  Net   1200 ml   Filed Weights   12/29/15 1730  Weight: 58.196 kg (128 lb 4.8 oz)    Examination: Constitutional: NAD Filed Vitals:   12/29/15 2051 12/29/15 2103 12/30/15 0400 12/30/15 1030  BP:   153/59 149/65  Pulse: 87  89 86  Temp:   98.3 F (36.8 C)   TempSrc:   Axillary   Resp: 20  17   Height:      Weight:      SpO2: 90% 96% 94%    Eyes: PERRLl Respiratory: clear to auscultation bilaterally, no wheezing, no crackles. Normal respiratory effort. No accessory muscle use.  Cardiovascular: Regular rate and rhythm, no murmurs / rubs / gallops. No LE edema. 2+ pedal pulses. No carotid bruits.  Abdomen: no tenderness. Bowel sounds positive.  Neurologic: non focal    Data Reviewed: I have personally reviewed following labs and imaging studies  CBC:  Recent Labs Lab 12/29/15 1141 12/29/15 1154 12/30/15 0540  WBC 11.6*  --  11.3*  NEUTROABS 9.4*  --   --   HGB 12.2 13.3 11.0*  HCT 38.3 39.0 36.0  MCV 93.0  --  96.5  PLT 207  --  210   Basic Metabolic Panel:  Recent Labs Lab 12/29/15 1141 12/29/15 1154 12/30/15  0540  NA 128* 129* 134*  K 4.6 4.6 5.2*  CL 93* 89* 97*  CO2 28  --  29  GLUCOSE 146* 143* 103*  BUN 32* 34* 30*  CREATININE 1.06* 1.00 1.25*  CALCIUM 8.3*  --  8.1*   GFR: Estimated Creatinine Clearance: 23.2 mL/min (by C-G formula based on Cr of 1.25). Liver Function Tests:  Recent Labs Lab 12/29/15 1141 12/30/15 0540  AST 32 25  ALT 21 17  ALKPHOS 64 61  BILITOT 1.5* 1.5*  PROT 6.3* 5.7*  ALBUMIN 2.9* 2.5*   No results for input(s): LIPASE, AMYLASE in the last 168 hours. No results for input(s): AMMONIA in the last 168 hours. Coagulation Profile:  Recent Labs Lab 12/29/15 1141 12/30/15 0540  INR 1.29 1.24   Cardiac Enzymes: No results for input(s): CKTOTAL, CKMB, CKMBINDEX, TROPONINI in the last 168 hours. BNP (last 3 results) No results for input(s): PROBNP in the last 8760 hours. HbA1C: No results for  input(s): HGBA1C in the last 72 hours. CBG:  Recent Labs Lab 12/29/15 1057  GLUCAP 160*   Lipid Profile: No results for input(s): CHOL, HDL, LDLCALC, TRIG, CHOLHDL, LDLDIRECT in the last 72 hours. Thyroid Function Tests: No results for input(s): TSH, T4TOTAL, FREET4, T3FREE, THYROIDAB in the last 72 hours. Anemia Panel: No results for input(s): VITAMINB12, FOLATE, FERRITIN, TIBC, IRON, RETICCTPCT in the last 72 hours. Urine analysis:    Component Value Date/Time   COLORURINE AMBER* 12/29/2015 1136   APPEARANCEUR CLEAR 12/29/2015 1136   LABSPEC 1.022 12/29/2015 1136   PHURINE 6.5 12/29/2015 1136   GLUCOSEU NEGATIVE 12/29/2015 1136   HGBUR NEGATIVE 12/29/2015 1136   BILIRUBINUR NEGATIVE 12/29/2015 1136   KETONESUR 15* 12/29/2015 1136   PROTEINUR NEGATIVE 12/29/2015 1136   NITRITE NEGATIVE 12/29/2015 1136   LEUKOCYTESUR NEGATIVE 12/29/2015 1136   Sepsis Labs: Invalid input(s): PROCALCITONIN, LACTICIDVEN  No results found for this or any previous visit (from the past 240 hour(s)).    Radiology Studies: Dg Chest 2 View  12/29/2015  CLINICAL DATA:  Hypoxia while sleeping and confusion EXAM: CHEST  2 VIEW COMPARISON:  None. FINDINGS: Cardiac shadow is within normal limits. Patchy interstitial changes are noted bilaterally without focal confluent infiltrate. No sizable effusion is seen. No acute bony abnormality noted. IMPRESSION: Patchy interstitial changes without acute abnormality. Electronically Signed   By: Alcide Clever M.D.   On: 12/29/2015 17:07   Dg Knee Complete 4 Views Right  12/29/2015  CLINICAL DATA:  Fall, confusion.  Right leg pain. EXAM: RIGHT KNEE - COMPLETE 4+ VIEW COMPARISON:  None. FINDINGS: There is chondrocalcinosis and moderate degenerative changes throughout the right knee. Moderate joint effusion. No acute bony abnormality. Specifically, no fracture, subluxation, or dislocation. Soft tissues are intact. IMPRESSION: Moderate degenerative changes with  chondrocalcinosis. Moderate joint effusion. No acute bony abnormality. Electronically Signed   By: Charlett Nose M.D.   On: 12/29/2015 13:34   Dg Hip Unilat With Pelvis 2-3 Views Right  12/29/2015  CLINICAL DATA:  Status post fall.  Right leg pain. EXAM: DG HIP (WITH OR WITHOUT PELVIS) 2-3V RIGHT COMPARISON:  None. FINDINGS: No acute fracture or dislocation. No lytic or sclerotic osseous lesion. Mild osteoarthritis of the right hip. Mild degenerative changes of the SI joints and lower lumbar spine. IMPRESSION: No acute osseous injury of the right hip. Electronically Signed   By: Elige Ko   On: 12/29/2015 13:35     Scheduled Meds: . losartan  50 mg Oral Daily  .  sodium chloride flush  3 mL Intravenous Q12H   Continuous Infusions: . sodium chloride 75 mL/hr at 12/29/15 1637  . pantoprozole (PROTONIX) infusion 8 mg/hr (12/30/15 0027)     Pamella Pert, MD, PhD [facility] Pager [phone]  If 7PM-7AM, please contact night-coverage [email or web address] Password [email or web address] 12/30/2015, 12:03 PM

## 2015-12-30 NOTE — Evaluation (Signed)
Physical Therapy Evaluation Patient Details Name: Elizabeth Booth MRN: [id number] DOB: May 23, 1923 Today's Date: 12/30/2015   History of Present Illness  Patient is a 82 y/o female with hx of HTN and dementia presents with acute melena. Pt with recent fall at home. Right knee effusion.  Clinical Impression  Patient presents with lethargy, generalized weakness, pain in right knee, and impaired balance impacting mobility. Mobility assessment limited due to decreased level of arousal. Tolerated SPT x4 with Max A of 2 for balance/safety. Pt reluctant to place weight through RLE. Pt has support from daughter at home. Pt independent PTA. If pt's function and mobility does not improve, pt most likely will need St SNF to maximize independence and mobility. Pt high fall risk at this time. Will follow acutely.     Follow Up Recommendations SNF    Equipment Recommendations  Other (comment) (TBA)    Recommendations for Other Services OT consult     Precautions / Restrictions Precautions Precautions: Fall Restrictions Weight Bearing Restrictions: No      Mobility  Bed Mobility Overal bed mobility: Needs Assistance Bed Mobility: Supine to Sit     Supine to sit: Max assist;HOB elevated     General bed mobility comments: Pt reaching for therapist with BUEs to get to EOB. Assist with BLEs and to elevate trunk. Posterior bias.  Transfers Overall transfer level: Needs assistance Equipment used: Rolling walker (2 wheeled);1 person hand held assist Transfers: Sit to/from [employer] Sit to Stand: Mod assist;+2 physical assistance;Max assist Stand pivot transfers: Max assist;+2 physical assistance       General transfer comment: Assist to stand from EOB x2, from [facility] x1. SPT bed to/from [facility]. SPT bed to chair with assist of 2. Pt reluctant with placing weight through RLE due to pain.  Ambulation/Gait                Stairs            Wheelchair Mobility     Modified Rankin (Stroke Patients Only)       Balance Overall balance assessment: Needs assistance Sitting-balance support: Feet supported;No upper extremity supported Sitting balance-Leahy Scale: Poor Sitting balance - Comments: Posterior LOB- requires Mod A for sitting balance. Pt lethargic. Postural control: Posterior lean Standing balance support: During functional activity Standing balance-Leahy Scale: Zero Standing balance comment: Able to stand for pericare with BUEs for support and Min A for standing balance.                              Pertinent Vitals/Pain Pain Assessment: Faces Faces Pain Scale: Hurts even more Pain Location: right knee with mobility or weight bearing Pain Descriptors / Indicators: Sore;Tender;Grimacing Pain Intervention(s): Monitored during session;Repositioned    Home Living Family/patient expects to be discharged to:: Private residence Living Arrangements: Children Available Help at Discharge: Family;Available 24 hours/day Type of Home: House Home Access: Level entry     Home Layout: One level Home Equipment: Walker - 2 wheels      Prior Function Level of Independence: Independent         Comments: Cleans house and cares for self.      Hand Dominance        Extremity/Trunk Assessment   Upper Extremity Assessment: Defer to OT evaluation           Lower Extremity Assessment: Generalized weakness;RLE deficits/detail;Difficult to assess due to impaired cognition RLE Deficits / Details: Swelling present  around right knee. Painful to palpation or mobility.        Communication   Communication: HOH  Cognition Arousal/Alertness: Lethargic;Suspect due to medications Behavior During Therapy: Flat affect Overall Cognitive Status: Difficult to assess Area of Impairment: Orientation;Attention Orientation Level: Disoriented to;Time;Situation Current Attention Level: Focused (most likely due to decreased level of  arousal )                General Comments General comments (skin integrity, edema, etc.): Family present in beginning of session.    Exercises        Assessment/Plan    PT Assessment Patient needs continued PT services  PT Diagnosis Difficulty walking;Altered mental status   PT Problem List Decreased strength;Decreased mobility;Decreased activity tolerance;Decreased balance;Decreased cognition;Cardiopulmonary status limiting activity  PT Treatment Interventions Therapeutic exercise;Gait training;Cognitive remediation;Therapeutic activities;Functional mobility training;Patient/family education;Balance training   PT Goals (Current goals can be found in the Care Plan section) Acute Rehab PT Goals Patient Stated Goal: none stated PT Goal Formulation: Patient unable to participate in goal setting Time For Goal Achievement: 01/13/16 Potential to Achieve Goals: Fair    Frequency Min 3X/week   Barriers to discharge        Co-evaluation               End of Session Equipment Utilized During Treatment: Gait belt Activity Tolerance: Patient limited by lethargy Patient left: in chair;with call bell/phone within reach;with chair alarm set Nurse Communication: Mobility status    Functional Assessment Tool Used: clinical judgment Functional Limitation: Mobility: Walking and moving around Mobility: Walking and Moving Around Current Status [phone](G8978): At least 60 percent but less than 80 percent impaired, limited or restricted Mobility: Walking and Moving Around Goal Status [phone]([id number]): At least 20 percent but less than 40 percent impaired, limited or restricted    Time: 1445-1515 PT Time Calculation (min) (ACUTE ONLY): 30 min   Charges:   PT Evaluation $PT Eval Moderate Complexity: 1 Procedure PT Treatments $Therapeutic Activity: 8-22 mins   PT G Codes:   PT G-Codes **NOT FOR INPATIENT CLASS** Functional Assessment Tool Used: clinical judgment Functional Limitation:  Mobility: Walking and moving around Mobility: Walking and Moving Around Current Status ([id number]): At least 60 percent but less than 80 percent impaired, limited or restricted Mobility: Walking and Moving Around Goal Status [phone]([id number]): At least 20 percent but less than 40 percent impaired, limited or restricted    Miracle Criado A Clarity Ciszek 12/30/2015, 4:02 PM Mylo RedShauna Samia Kukla, PT, DPT [phone]

## 2015-12-30 NOTE — Progress Notes (Signed)
Pt was noted to be apneic and difficult to arouse while wearing the venturi mask on 8 liters of oxygen. Oxygen saturation was 94%. RN called rapid response nurse. Rapid response nurse came to assess patient and informed nurse to call MD. At 0400, RN paged Kirby,NP. Craige CottaKirby, NP came to floor and assessed the pt. Craige CottaKirby, NP gave verbal order for 0.4mg  of Narcan to RN to administer to patient. 0.4mg  of Narcan was administered to patient while Craige CottaKirby, NP at bedside. After Narcan was administered, pt became more alert. Will continue to monitor and treat per MD orders.

## 2015-12-30 NOTE — Progress Notes (Addendum)
RN, Baxter HireKristen, paged NP because pt was difficult to arouse and having periods of apnea. NP to bedside.  In review of chart, pt received Haldol 5mg , Ativan 0.5mg  IV x 2, and Norco ii prior to coming to the nsg floor. Also, MD note states that pt was having periods of hypoxia earlier as well.  S: pt can not participate in ROS due to mental status. O: Pt is lethargic but arouses to noxious stimuli. Moans. Able to tell NP her name. Falls back to sleep quickly. Observed short periods of apnea. On VM with normal SaO2.  A/P: 1. Apnea spells-likely oversedation. Try Narcan but doubt this will work. Likely, Haldol and Ativan. ? Sleep apnea has O2 sat dropped with sleeping earlier and now this presentation.  Continue to monitor closely. Will leave note for attending to address code status in this 82 yo lady who is presently a full code.  Jimmye NormanKaren Kirby-Graham, NP [facility], d/c'd Haldol and Ativan prn. Reduced Norco to i po q6h prn severe pain.  KJKG, NP Update: per RN, pt more alert/awake after narcan. Will d/c narcotics as well.  KJKG, NP

## 2015-12-30 NOTE — Progress Notes (Signed)
No signs of active GI bleeding. Since family did not wish intervention we will sign off and be on standby. Call us if needed.

## 2015-12-31 DIAGNOSIS — K922 Gastrointestinal hemorrhage, unspecified: Secondary | ICD-10-CM | POA: Diagnosis not present

## 2015-12-31 DIAGNOSIS — K921 Melena: Secondary | ICD-10-CM

## 2015-12-31 DIAGNOSIS — Z515 Encounter for palliative care: Secondary | ICD-10-CM | POA: Insufficient documentation

## 2015-12-31 DIAGNOSIS — Z7189 Other specified counseling: Secondary | ICD-10-CM | POA: Diagnosis not present

## 2015-12-31 DIAGNOSIS — I10 Essential (primary) hypertension: Secondary | ICD-10-CM | POA: Diagnosis not present

## 2015-12-31 LAB — BASIC METABOLIC PANEL
Anion gap: 8 (ref 5–15)
BUN: 18 mg/dL (ref 6–20)
CHLORIDE: 104 mmol/L (ref 101–111)
CO2: 24 mmol/L (ref 22–32)
CREATININE: 0.84 mg/dL (ref 0.44–1.00)
Calcium: 8 mg/dL — ABNORMAL LOW (ref 8.9–10.3)
GFR calc Af Amer: >60 mL/min (ref >60)
GFR calc non Af Amer: 59 mL/min — ABNORMAL LOW (ref >60)
GLUCOSE: 99 mg/dL (ref 65–99)
Potassium: 4.1 mmol/L (ref 3.5–5.1)
SODIUM: 136 mmol/L (ref 135–145)

## 2015-12-31 NOTE — Progress Notes (Signed)
Pt disoriented X4. Pt continuously requesting to call her daughter. RN attempted to call patient's daughter multiple times with no success. Will continue to monitor and treat per MD orders.

## 2015-12-31 NOTE — NC FL2 (Deleted)
Entered in error

## 2015-12-31 NOTE — Progress Notes (Signed)
PROGRESS NOTE  Elizabeth Booth:[id number] DOB: 1923-03-18 DOA: 12/29/2015 PCP: [name], MD     Brief Narrative: 82 y.o. female with medical history significant for HTN, dementia, presenting to the ED with acute melena. She was noted to have large melanotic stools 3 days ago, without any prior similar events. Since then, she has had increasing frequency of these stools, more liqquid, as as per daughter's report, unable to make it to the bathroom, soiling herself. GI consulted on admission  Assessment & Plan: Active Problems:   Melena   Hypertension   GI bleed   GI Bleed / melena  - Patient with recent history of increased NSAID for knee pain after mechanical fall No history of GIB in the past. No ETOH  - Hb overall stable 12.2 >> 13.3 >> 11.0 - continue protonix. - appreciate GI input - Avoid NSAIDs - pt and family does not want any interventions.  Started her on clear liquid diet.    Intermittent Hypoxia while sleeping, O2 sats while awake is normal.  - Chest x ray negative  Hypertension BP 139/53 mmHg  - Controlled with home antihypertensives, continue present therapy   Hyponatremia  - due to dehydration, improving with fluids - hold HCTZ - stop fluids 6/7  Recent mechanical fall in a patient with dementia,  - negative XR right knee and hip for fracture. + right knee effusion.  - Consult PT/OT consultation    Hyperkalemia: Repeat BMP TODAY.   Dementia, not on meds   DVT prophylaxis: SCD Code Status: Full Family Communication: discussed with daughter at bedside.  Disposition Plan: TBD, PENDING palliative care consult.   Consultants:   GI  Procedures:   None   Antimicrobials:  None    Subjective: - NAD, very confused, no complaints  Objective: Filed Vitals:   12/30/15 1352 12/30/15 2125 12/31/15 0049 12/31/15 0437  BP: 135/48 150/55  124/47  Pulse: 80 93  85  Temp: 98 F (36.7 C) 98.4 F (36.9 C)  98.3 F (36.8 C)    TempSrc:  Oral  Oral  Resp: Height:      Weight:      SpO2: 98% 98% 93% 96%    Intake/Output Summary (Last 24 hours) at 12/31/15 1346 Last data filed at 12/31/15 0647  Gross per 24 hour  Intake   1203 ml  Output      0 ml  Net   1203 ml   Filed Weights   12/29/15 1730  Weight: 58.196 kg (128 lb 4.8 oz)    Examination: Constitutional: NAD Filed Vitals:   12/30/15 1352 12/30/15 2125 12/31/15 0049 12/31/15 0437  BP: 135/48 150/55  124/47  Pulse: 80 93  85  Temp: 98 F (36.7 C) 98.4 F (36.9 C)  98.3 F (36.8 C)  TempSrc:  Oral  Oral  Resp: Height:      Weight:      SpO2: 98% 98% 93% 96%    Respiratory: clear to auscultation bilaterally, no wheezing, no crackles. Normal respiratory effort. No accessory muscle use.  Cardiovascular: Regular rate and rhythm, no murmurs / rubs / gallops. No LE edema. 2+ pedal pulses. No carotid bruits.  Abdomen:  Soft, no tenderness. Bowel sounds positive.  Neurologic: non focal    Data Reviewed: I have personally reviewed following labs and imaging studies  CBC:  Recent Labs Lab 12/29/15 1141 12/29/15 1154 12/30/15 0540  WBC 11.6*  --  11.3*  NEUTROABS 9.4*  --   --   HGB 12.2 13.3 11.0*  HCT 38.3 39.0 36.0  MCV 93.0  --  96.5  PLT 207  --  210   Basic Metabolic Panel:  Recent Labs Lab 12/29/15 1141 12/29/15 1154 12/30/15 0540  NA 128* 129* 134*  K 4.6 4.6 5.2*  CL 93* 89* 97*  CO2 28  --  29  GLUCOSE 146* 143* 103*  BUN 32* 34* 30*  CREATININE 1.06* 1.00 1.25*  CALCIUM 8.3*  --  8.1*   GFR: Estimated Creatinine Clearance: 23.2 mL/min (by C-G formula based on Cr of 1.25). Liver Function Tests:  Recent Labs Lab 12/29/15 1141 12/30/15 0540  AST 32 25  ALT 21 17  ALKPHOS 64 61  BILITOT 1.5* 1.5*  PROT 6.3* 5.7*  ALBUMIN 2.9* 2.5*   No results for input(s): LIPASE, AMYLASE in the last 168 hours. No results for input(s): AMMONIA in the last 168 hours. Coagulation Profile:  Recent  Labs Lab 12/29/15 1141 12/30/15 0540  INR 1.29 1.24   Cardiac Enzymes: No results for input(s): CKTOTAL, CKMB, CKMBINDEX, TROPONINI in the last 168 hours. BNP (last 3 results) No results for input(s): PROBNP in the last 8760 hours. HbA1C: No results for input(s): HGBA1C in the last 72 hours. CBG:  Recent Labs Lab 12/29/15 1057  GLUCAP 160*   Lipid Profile: No results for input(s): CHOL, HDL, LDLCALC, TRIG, CHOLHDL, LDLDIRECT in the last 72 hours. Thyroid Function Tests: No results for input(s): TSH, T4TOTAL, FREET4, T3FREE, THYROIDAB in the last 72 hours. Anemia Panel: No results for input(s): VITAMINB12, FOLATE, FERRITIN, TIBC, IRON, RETICCTPCT in the last 72 hours. Urine analysis:    Component Value Date/Time   COLORURINE AMBER* 12/29/2015 1136   APPEARANCEUR CLEAR 12/29/2015 1136   LABSPEC 1.022 12/29/2015 1136   PHURINE 6.5 12/29/2015 1136   GLUCOSEU NEGATIVE 12/29/2015 1136   HGBUR NEGATIVE 12/29/2015 1136   BILIRUBINUR NEGATIVE 12/29/2015 1136   KETONESUR 15* 12/29/2015 1136   PROTEINUR NEGATIVE 12/29/2015 1136   NITRITE NEGATIVE 12/29/2015 1136   LEUKOCYTESUR NEGATIVE 12/29/2015 1136   Sepsis Labs: Invalid input(s): PROCALCITONIN, LACTICIDVEN  No results found for this or any previous visit (from the past 240 hour(s)).    Radiology Studies: Dg Chest 2 View  12/29/2015  CLINICAL DATA:  Hypoxia while sleeping and confusion EXAM: CHEST  2 VIEW COMPARISON:  None. FINDINGS: Cardiac shadow is within normal limits. Patchy interstitial changes are noted bilaterally without focal confluent infiltrate. No sizable effusion is seen. No acute bony abnormality noted. IMPRESSION: Patchy interstitial changes without acute abnormality. Electronically Signed   By: Alcide CleverMark  Lukens M.D.   On: 12/29/2015 17:07     Scheduled Meds: . sodium chloride flush  3 mL Intravenous Q12H   Continuous Infusions: . sodium chloride 75 mL/hr at 12/29/15 1637  . pantoprozole (PROTONIX) infusion  8 mg/hr (12/31/15 0235)    Kathlen ModyVijaya Dartagnan Beavers, MD, . [facility] Pager [phone]  If 7PM-7AM, please contact night-coverage [email or web address] Password [email or web address] 12/31/2015, 1:46 PM

## 2015-12-31 NOTE — Consult Note (Signed)
Consultation Note Date: 12/31/2015   Patient Name: Elizabeth Booth  DOB: 06/24/1923  MRN: [id number]  Age / Sex: 82 y.o., female  PCP: Kirby FunkJohn Griffin, MD Referring Physician: Kathlen ModyVijaya Akula, MD  Reason for Consultation: Establishing goals of care  HPI/Patient Profile: 82 y.o. female  with past medical history of htn and dementia admitted on 12/29/2015 with diarrhea and dehydration.  Clinical Assessment and Goals of Care: Patient has moderate dementia.  She lives at home with her daughter who is a full time care taker.  The patient's son was killed in an MVA 17 yrs ago.  She was walking and eating well at home, but was forgetful.  In the hospital she is not eating.  She is alert, and very pleasantly demented.   She has been in bed for 3 days and was noted to be a 2 person Max assist by PT.    We discussed end of life goals and code status.  Daughter stated her mother was too independent and they did not discuss those subjects.  She was unable to give an opinion today.  I gave her my thoughts and left her with a "Hard Choices" booklet.  I will follow up with her tomorrow to continue the Code Status discussion.  If she is discharged please order "Outpaitent Palliative Medicine follow up" to continue the goals of care conversation.  Encouraged daughter to consider short term SNF rehab in order to allow her mother to become strong enough to get to the bathroom or the bedside commode.  Dtr seemed against this idea and wants to take her mother home.     Discussed trajectory of dementia.  The end stages involve being bed ridden, recurrent UTIs, decreased PO intake and probable aspiration.  The daughter understood these things - she has worked at [employer] [employer] in the past.  We discussed Hospice eligibility.  Currently the patient is not eligible for hospice based on the FAST 7C criteria for Alzheimer's Dementia, but  I anticipate that she will be soon.  Next of Kin:  Daughter Merrilee JanskyJeanie Gauldin.       SUMMARY OF RECOMMENDATIONS    Home with full Home Health Services including:  RN, Aide, SW, PT, OT  Outpatient Palliative Medicine follow up.  PMT will follow the family while they are in the hospital.  Code Status/Advance Care Planning:  Full code    Symptom Management:   Per primary TRH attending.   Psycho-social/Spiritual:   Desire for further Chaplaincy support:yes  Additional Recommendations: Caregiving  Support/Resources  Prognosis:   < 12 months  Discharge Planning: Home with Home Health      Primary Diagnoses: Present on Admission:  . Melena . Hypertension . GI bleed  I have reviewed the medical record, interviewed the patient and family, and examined the patient. The following aspects are pertinent.  Past Medical History  Diagnosis Date  . Hypertension   . Dementia    Social History   Social History  . Marital Status: Widowed  Spouse Name: N/A  . Number of Children: N/A  . Years of Education: N/A   Social History Main Topics  . Smoking status: Never Smoker   . Smokeless tobacco: Not on file  . Alcohol Use: No  . Drug Use: No  . Sexual Activity: Not on file   Other Topics Concern  . Not on file   Social History Narrative   No family history on file. Scheduled Meds: . sodium chloride flush  3 mL Intravenous Q12H   Continuous Infusions: . sodium chloride 75 mL/hr at 12/29/15 1637  . pantoprozole (PROTONIX) infusion 8 mg/hr (12/31/15 0235)   PRN Meds:.acetaminophen **OR** acetaminophen, ondansetron **OR** ondansetron (ZOFRAN) IV Medications Prior to Admission:  Prior to Admission medications   Medication Sig Start Date End Date Taking? Authorizing Provider  losartan (COZAAR) 50 MG tablet Take 50 mg by mouth daily.  05/13/13  Yes Historical Provider, MD   Allergies  Allergen Reactions  . Penicillins Anaphylaxis   Review of Systems  Physical  Exam  Vital Signs: BP 124/47 mmHg  Pulse 85  Temp(Src) 98.3 F (36.8 C) (Oral)  Resp 20  Ht  (1.575 m)  Wt 58.196 kg (128 lb 4.8 oz)  BMI 23.46 kg/m2  SpO2 96% Pain Assessment: No/denies pain   Pain Score: Asleep   SpO2: SpO2: 96 % O2 Device:SpO2: 96 % O2 Flow Rate: .O2 Flow Rate (L/min): 3 L/min  IO: Intake/output summary:  Intake/Output Summary (Last 24 hours) at 12/31/15 1436 Last data filed at 12/31/15 1610  Gross per 24 hour  Intake   1203 ml  Output      0 ml  Net   1203 ml    LBM: Last BM Date: 12/30/15 Baseline Weight: Weight: 58.196 kg (128 lb 4.8 oz) Most recent weight: Weight: 58.196 kg (128 lb 4.8 oz)     Palliative Assessment/Data:   Flowsheet Rows        Most Recent Value   Intake Tab    Referral Department  Hospitalist   Unit at Time of Referral  Med/Surg Unit   Palliative Care Primary Diagnosis  Other (Comment)   Date Notified  12/30/15   Palliative Care Type  New Palliative care   Date of Admission  12/29/15   # of days IP prior to Palliative referral  1   Clinical Assessment    Palliative Performance Scale Score  30%   Psychosocial & Spiritual Assessment    Palliative Care Outcomes    Patient/Family meeting held?  Yes   Who was at the meeting?  patient and daughter   Palliative Care Outcomes  Other (Comment)   Palliative Care follow-up planned  Yes, Home      Time In: 1:00  Time Out: 2:10 Time Total: 70 Greater than 50%  of this time was spent counseling and coordinating care related to the above assessment and plan.  Signed by: Algis Downs, PA-C Palliative Medicine Pager: [phone]   Please contact Palliative Medicine Team phone at [phone] for questions and concerns.  For individual provider: See Loretha Stapler

## 2015-12-31 NOTE — Progress Notes (Signed)
CSW met with patient and pt dtr at bedside.  Pt dtr states that she would like to take patient home- feels as if pt is feeling much better today and would be able to do more than during yesterdays session with PT.  Dtr reports that she lives with the pt and that they are very active- she is interested in home services  RNCM informed of refusal of SNF and interest in home services  CSW signing off.  Domenica Reamer, [place] Social Worker [phone]

## 2015-12-31 NOTE — Care Management Obs Status (Signed)
MEDICARE OBSERVATION STATUS NOTIFICATION   Patient Details  Name: Elizabeth Booth MRN: [name] Date of Birth: 01/20/1923   Medicare Observation Status Notification Given:  Yes    Epifanio LeschesCole, Blossom Crume Hudson, RN 12/31/2015, 2:39 PM

## 2015-12-31 NOTE — Plan of Care (Signed)
Problem: Safety: Goal: Ability to remain free from injury will improve Outcome: Not Progressing Patient has periods of confusion and keeps trying to get out of bed. Bed alarm set.

## 2016-01-01 DIAGNOSIS — I10 Essential (primary) hypertension: Secondary | ICD-10-CM | POA: Diagnosis not present

## 2016-01-01 DIAGNOSIS — K922 Gastrointestinal hemorrhage, unspecified: Secondary | ICD-10-CM | POA: Diagnosis not present

## 2016-01-01 DIAGNOSIS — K921 Melena: Secondary | ICD-10-CM | POA: Diagnosis not present

## 2016-01-01 LAB — CBC
HEMATOCRIT: 33.3 % — AB (ref 36.0–46.0)
Hemoglobin: 10.5 g/dL — ABNORMAL LOW (ref 12.0–15.0)
MCH: 29.3 pg (ref 26.0–34.0)
MCHC: 31.5 g/dL (ref 30.0–36.0)
MCV: 93 fL (ref 78.0–100.0)
Platelets: 226 10*3/uL (ref 150–400)
RBC: 3.58 MIL/uL — ABNORMAL LOW (ref 3.87–5.11)
RDW: 13.5 % (ref 11.5–15.5)
WBC: 9 10*3/uL (ref 4.0–10.5)

## 2016-01-01 MED ORDER — PANTOPRAZOLE SODIUM 40 MG PO TBEC
40.0000 mg | DELAYED_RELEASE_TABLET | Freq: Two times a day (BID) | ORAL | Status: DC
  Administered 2016-01-01 – 2016-01-02 (×2): 40 mg via ORAL
  Filled 2016-01-01 (×2): qty 1

## 2016-01-01 MED ORDER — AMLODIPINE BESYLATE 5 MG PO TABS
5.0000 mg | ORAL_TABLET | Freq: Every day | ORAL | Status: DC
  Administered 2016-01-01 – 2016-01-02 (×2): 5 mg via ORAL
  Filled 2016-01-01 (×2): qty 1

## 2016-01-01 MED ORDER — PANTOPRAZOLE SODIUM 40 MG PO TBEC: 40.0000 mg | DELAYED_RELEASE_TABLET | Freq: Two times a day (BID) | ORAL | Status: DC

## 2016-01-01 MED ORDER — AMLODIPINE BESYLATE 5 MG PO TABS: 5.0000 mg | ORAL_TABLET | Freq: Every day | ORAL | Status: AC

## 2016-01-01 NOTE — Progress Notes (Signed)
Daughter not answering phone.

## 2016-01-01 NOTE — Care Management Note (Signed)
Case Management Note  Patient Details  Name: Elizabeth HabermannHelen S Booth MRN: [id number] Date of Birth: 07/07/1923  Subjective/Objective:                    Action/Plan: Plan is to d/c to home today with daughter with home health services in place. Expected Discharge Date:    01/01/2016           Expected Discharge Plan:  Home w Home Health Services  In-House Referral:     Discharge planning Services  CM Consult  Post Acute Care Choice:    Choice offered to:  Adult Children  DME Arranged:    DME Agency:     HH Arranged:  RN, PT, OT, Nurse's Aide, Social Work [employer] Agency:  [facility]/referral made with [phone]  Status of Service:  Completed, signed off  Medicare Important Message Given:    Date Medicare IM Given:    Medicare IM give by:    Date Additional Medicare IM Given:    Additional Medicare Important Message give by:     If discussed at Long Length of Stay Meetings, dates discussed:    Additional Comments:  Epifanio LeschesCole, Denecia Brunette Hudson, ArizonaRN,BSN,CM [phone] 01/01/2016, 3:40 PM

## 2016-01-01 NOTE — Progress Notes (Signed)
Physical Therapy Treatment Patient Details Name: Elizabeth HabermannHelen S Booth MRN: [id number] DOB: 12/06/1922 Today's Date: 01/01/2016    History of Present Illness Patient is a 82 y/o female with hx of HTN and dementia presents with acute melena. Pt with recent fall at home. Right knee effusion.    PT Comments    Patient currently max A +2 for mobility and unable to take steps this session. Incontinent of urine X 2 during session and pt returned to bed. Daughter present for session. Continue to recommend SNF for further skilled PT services.   Follow Up Recommendations  SNF     Equipment Recommendations  Other (comment)    Recommendations for Other Services OT consult     Precautions / Restrictions Precautions Precautions: Fall Restrictions Weight Bearing Restrictions: No    Mobility  Bed Mobility Overal bed mobility: Needs Assistance Bed Mobility: Supine to Sit     Supine to sit: Max assist;HOB elevated     General bed mobility comments: verbal and tactile cues for sequencing and technique; assist to bring bilat LE to EOB and elevate trunk into sitting  Transfers Overall transfer level: Needs assistance Equipment used: Rolling walker (2 wheeled);1 person hand held assist Transfers: Sit to/from Stand Sit to Stand: Max assist         General transfer comment: sit to stand X3 from EOB with max A +2 power up into standing and maintain balance with assist at low back and hips for posture; pt leaned anteriorly and with decreased ability to WS to the R; physical assist to WS to L side with abiltiy to advance R foot f/b once and to the side once  Ambulation/Gait                 Stairs            Wheelchair Mobility    Modified Rankin (Stroke Patients Only)       Balance   Sitting-balance support: Feet supported;No upper extremity supported Sitting balance-Leahy Scale: Poor     Standing balance support: Bilateral upper extremity supported Standing  balance-Leahy Scale: Zero                      Cognition Arousal/Alertness: Awake/alert Behavior During Therapy: Flat affect Overall Cognitive Status: Within Functional Limits for tasks assessed Area of Impairment: Following commands;Problem solving       Following Commands: Follows one step commands with increased time     Problem Solving: Slow processing;Decreased initiation;Requires verbal cues;Requires tactile cues      Exercises      General Comments General comments (skin integrity, edema, etc.): daughter present during session; pt incontinent of urine X2 during session      Pertinent Vitals/Pain Pain Assessment: Faces Faces Pain Scale: Hurts even more Pain Location: bilat LE with mobility Pain Descriptors / Indicators: Grimacing;Guarding;Sore Pain Intervention(s): Monitored during session;Repositioned    Home Living                      Prior Function            PT Goals (current goals can now be found in the care plan section) Acute Rehab PT Goals Patient Stated Goal: none stated Progress towards PT goals: Not progressing toward goals - comment    Frequency  Min 3X/week    PT Plan Current plan remains appropriate    Co-evaluation             End of  Session Equipment Utilized During Treatment: Gait belt Activity Tolerance: Patient limited by pain Patient left: in bed;with call bell/phone within reach;with bed alarm set;with family/visitor present     Time: 1420-1446 PT Time Calculation (min) (ACUTE ONLY): 26 min  Charges:  $Therapeutic Activity: 23-37 mins                    G Codes:      Derek Mound, PTA Pager: [phone]   01/01/2016, 4:21 PM

## 2016-01-01 NOTE — Progress Notes (Addendum)
   CM received consult : Please request outpatient palliative medicine follow up [facility] Thanks.  MD made CM aware hospital palliative services will give information to pt/daughter regarding palliative medicine f/u. Gae Gallopngela Jamason Pe[facility] Embassy Surgery CenterRN,BSN,CM [phone] 01/01/2016 @ 1348 CM made referral with [facility] Drenda Freeze( [facility]- Referral Specialist) @ [phone] regarding outpatient palliative medicine follow up.

## 2016-01-01 NOTE — Progress Notes (Signed)
Attempted X 2 to call daughter about pt's discharge. No other numbers in demography. Will try again later

## 2016-01-01 NOTE — Discharge Summary (Signed)
Physician Discharge Summary  Elizabeth Booth:[id number] DOB: 06-Mar-1923 DOA: 12/29/2015  PCP: [name], MD  Admit date: 12/29/2015 Discharge date: 01/01/2016  Admitted From: Home Disposition:  Home  Recommendations for Outpatient Follow-up:  1. Follow up with PCP in 1-2 weeks 2. Please obtain BMP/CBC in one week 3. Please follow up on the following pending results:  Home Health:yes  Discharge Condition: guarded CODE STATUS:full Diet recommendation:  Regular   Brief/Interim Summary: 82 y.o. female with medical history significant for HTN, dementia, presenting to the ED with acute melena. She was noted to have large melanotic stools 3 days ago, without any prior similar events. Since then, she has had increasing frequency of these stools, more liqquid, as as per daughter's report, unable to make it to the bathroom, soiling herself. GI consulted on admission. She was admitted for GI bleed, and her hemoglobin dropped to 11 from 12.2 and then to 10.5. No more melanotic stools. GI consulted, recommended EGD,but daugter does not want any interventions at this time.she will be discharged on PPI BID and re check cbc in one week at PCP office. Avoid NSAIDS.    Hypertension: Sub optimal. Stopped the ARB because of AKI and hyperkalemia.   Hyponatremia  - due to dehydration, improving with fluids - hold HCTZ - stopped fluids 6/7  Recent mechanical fall in a patient with dementia,  - negative XR right knee and hip for fracture. + right knee effusion.  - Consult PT/OT consultation recommended home health.   Dementia; not on meds. Pt would like this to be addressed with PCP.   Discharge Diagnoses:  Active Problems:   Melena   Hypertension   GI bleed   Palliative care encounter   Goals of care, counseling/discussion   DNR (do not resuscitate) discussion    Discharge Instructions  Discharge Instructions    Diet general    Complete by:  As directed      Discharge  instructions    Complete by:  As directed   Follow up with PCP in 2 weeks.  Follow up with gastroenterology as needed.            Medication List    STOP taking these medications        losartan 50 MG tablet  Commonly known as:  COZAAR      TAKE these medications        pantoprazole 40 MG tablet  Commonly known as:  PROTONIX  Take 1 tablet (40 mg total) by mouth 2 (two) times daily before a meal.           Follow-up Information    Follow up with [facility].   Why:  Home health RN,PT,OT, Nurse Aide and SW   Contact information:   [address] [place] [address] [phone]       Follow up with [name], MD. Schedule an appointment as soon as possible for a visit in 1 week.   Specialty:  Internal Medicine   Why:  please check your cbc to check your hemoglobin.    Contact information:   [address] [place] [address] [phone]       Follow up with [name], MD.   Specialty:  Gastroenterology   Why:  As needed   Contact information:   [address] [place] [address] [phone]      Allergies  Allergen Reactions  . Penicillins Anaphylaxis  Consultations:   Dr Evette CristalGanem Gastroenterology.     Procedures/Studies: Dg Chest 2 View  12/29/2015  CLINICAL DATA:  Hypoxia while sleeping and confusion EXAM: CHEST  2 VIEW COMPARISON:  None. FINDINGS: Cardiac shadow is within normal limits. Patchy interstitial changes are noted bilaterally without focal confluent infiltrate. No sizable effusion is seen. No acute bony abnormality noted. IMPRESSION: Patchy interstitial changes without acute abnormality. Electronically Signed   By: Alcide CleverMark  Lukens M.D.   On: 12/29/2015 17:07   Dg Knee Complete 4 Views Right  12/29/2015  CLINICAL DATA:  Fall, confusion.  Right leg pain. EXAM: RIGHT KNEE - COMPLETE 4+ VIEW COMPARISON:  None. FINDINGS: There is chondrocalcinosis and moderate degenerative  changes throughout the right knee. Moderate joint effusion. No acute bony abnormality. Specifically, no fracture, subluxation, or dislocation. Soft tissues are intact. IMPRESSION: Moderate degenerative changes with chondrocalcinosis. Moderate joint effusion. No acute bony abnormality. Electronically Signed   By: Charlett NoseKevin  Dover M.D.   On: 12/29/2015 13:34   Dg Hip Unilat With Pelvis 2-3 Views Right  12/29/2015  CLINICAL DATA:  Status post fall.  Right leg pain. EXAM: DG HIP (WITH OR WITHOUT PELVIS) 2-3V RIGHT COMPARISON:  None. FINDINGS: No acute fracture or dislocation. No lytic or sclerotic osseous lesion. Mild osteoarthritis of the right hip. Mild degenerative changes of the SI joints and lower lumbar spine. IMPRESSION: No acute osseous injury of the right hip. Electronically Signed   By: Elige KoHetal  Patel   On: 12/29/2015 13:35    (Echo, Carotid, EGD, Colonoscopy, ERCP)    Subjective:   Discharge Exam: Filed Vitals:   12/31/15 2146 01/01/16 0631  BP: 156/72 165/69  Pulse: 108 94  Temp: 97.8 F (36.6 C) 97.8 F (36.6 C)  Resp: 18 17   Filed Vitals:   12/31/15 0437 12/31/15 1529 12/31/15 2146 01/01/16 0631  BP: 124/47 111/42 156/72 165/69  Pulse: 85 85 108 94  Temp: 98.3 F (36.8 C) 98 F (36.7 C) 97.8 F (36.6 C) 97.8 F (36.6 C)  TempSrc: Oral Oral    Resp: 20 18 18 17   Height:      Weight:      SpO2: 96% 98%  95%    General: Pt is alert, awake, not in acute distress Cardiovascular: RRR, S1/S2 +, no rubs, no gallops Respiratory: CTA bilaterally, no wheezing, no rhonchi Abdominal: Soft, NT, ND, bowel sounds + Extremities: no edema, no cyanosis    The results of significant diagnostics from this hospitalization (including imaging, microbiology, ancillary and laboratory) are listed below for reference.     Microbiology: No results found for this or any previous visit (from the past 240 hour(s)).   Labs: BNP (last 3 results) No results for input(s): BNP in the last 8760  hours. Basic Metabolic Panel:  Recent Labs Lab 12/29/15 1141 12/29/15 1154 12/30/15 0540 12/31/15 1458  NA 128* 129* 134* 136  K 4.6 4.6 5.2* 4.1  CL 93* 89* 97* 104  CO2 28  --  29 24  GLUCOSE 146* 143* 103* 99  BUN 32* 34* 30* 18  CREATININE 1.06* 1.00 1.25* 0.84  CALCIUM 8.3*  --  8.1* 8.0*   Liver Function Tests:  Recent Labs Lab 12/29/15 1141 12/30/15 0540  AST 32 25  ALT 21 17  ALKPHOS 64 61  BILITOT 1.5* 1.5*  PROT 6.3* 5.7*  ALBUMIN 2.9* 2.5*   No results for input(s): LIPASE, AMYLASE in the last 168 hours. No results for input(s): AMMONIA in the last 168 hours.  CBC:  Recent Labs Lab 12/29/15 1141 12/29/15 1154 12/30/15 0540 01/01/16 1248  WBC 11.6*  --  11.3* 9.0  NEUTROABS 9.4*  --   --   --   HGB 12.2 13.3 11.0* 10.5*  HCT 38.3 39.0 36.0 33.3*  MCV 93.0  --  96.5 93.0  PLT 207  --  210 226   Cardiac Enzymes: No results for input(s): CKTOTAL, CKMB, CKMBINDEX, TROPONINI in the last 168 hours. BNP: Invalid input(s): POCBNP CBG:  Recent Labs Lab 12/29/15 1057  GLUCAP 160*   D-Dimer No results for input(s): DDIMER in the last 72 hours. Hgb A1c No results for input(s): HGBA1C in the last 72 hours. Lipid Profile No results for input(s): CHOL, HDL, LDLCALC, TRIG, CHOLHDL, LDLDIRECT in the last 72 hours. Thyroid function studies No results for input(s): TSH, T4TOTAL, T3FREE, THYROIDAB in the last 72 hours.  Invalid input(s): FREET3 Anemia work up No results for input(s): VITAMINB12, FOLATE, FERRITIN, TIBC, IRON, RETICCTPCT in the last 72 hours. Urinalysis    Component Value Date/Time   COLORURINE AMBER* 12/29/2015 1136   APPEARANCEUR CLEAR 12/29/2015 1136   LABSPEC 1.022 12/29/2015 1136   PHURINE 6.5 12/29/2015 1136   GLUCOSEU NEGATIVE 12/29/2015 1136   HGBUR NEGATIVE 12/29/2015 1136   BILIRUBINUR NEGATIVE 12/29/2015 1136   KETONESUR 15* 12/29/2015 1136   PROTEINUR NEGATIVE 12/29/2015 1136   NITRITE NEGATIVE 12/29/2015 1136    LEUKOCYTESUR NEGATIVE 12/29/2015 1136   Sepsis Labs Invalid input(s): PROCALCITONIN,  WBC,  LACTICIDVEN Microbiology No results found for this or any previous visit (from the past 240 hour(s)).   Time coordinating discharge: Over 30 minutes  SIGNED:   Kathlen Mody, MD  [facility] 01/01/2016, 4:24 PM Pager [phone]   If 7PM-7AM, please contact night-coverage [email or web address] Password [email or web address]

## 2016-01-01 NOTE — Consult Note (Signed)
   [facility] CM Inpatient Consult   01/01/2016  Elizabeth Booth 05-07-1923 [id number]  Patient screened for potential [place] Management services. Patient is eligible for [place].  Patient is  a 82 y.o. female with a Past Medical History of hypertension, dementia who presents with GI bleed.   Electronic medical record reveals patient's discharge plan is home with home health services and palliative care to follow up. [facility] Care Management services will not appropriate at this time. Met with daughter, Elizabeth Booth,  and gave her a brochure.  Palliative Care services can provide full care management services at home with home health care. Spoke with inpatient RNCM regarding giving family contact information.   If patient's post hospital needs change please place a [facility] Care Management consult. For questions please contact:   Natividad Brood, RN BSN [facility] Liaison  [phone] business mobile phone Toll free office [phone]

## 2016-01-02 ENCOUNTER — Observation Stay (HOSPITAL_COMMUNITY): Payer: PPO

## 2016-01-02 DIAGNOSIS — Z66 Do not resuscitate: Secondary | ICD-10-CM | POA: Diagnosis not present

## 2016-01-02 DIAGNOSIS — Z515 Encounter for palliative care: Secondary | ICD-10-CM | POA: Diagnosis not present

## 2016-01-02 NOTE — Progress Notes (Signed)
Patient discharge education and medications reviewed with patient and family. PIV removed. No questions or concerns at this time.

## 2016-01-02 NOTE — Progress Notes (Signed)
Daily Progress Note   Patient Name: Elizabeth HabermannHelen S Booth       Date: 01/02/2016 DOB: 11/10/1922  Age: 82 y.o. MRN#: [id number] Attending Physician: Kathlen ModyVijaya Akula, MD Primary Care Physician: Lillia MountainGRIFFIN,JOHN JOSEPH, MD Admit Date: 12/29/2015  Reason for Consultation/Follow-up: Establishing goals of care and Psychosocial/spiritual support  Subjective: Patient is pleasantly demented.  I spoke with her daughter regarding code status.  She says her mother would not want to be on life support.  She would not want to be intubated or have chest compressions.  The daughter spontaneously cried and reflected on her brother's sudden death.  She had watched him be intubated.    We also discussed Hospice services once again.  The dtr politely declined stating that she didn't think her mother is ready for Hospice yet.   Dr. Valentina LucksGriffin is her PCP.  I let her know that he could engage Hospice when she needed them.  Length of Stay:   Current Medications: Scheduled Meds:  . amLODipine  5 mg Oral Daily  . pantoprazole  40 mg Oral BID AC  . sodium chloride flush  3 mL Intravenous Q12H    Continuous Infusions: . sodium chloride 75 mL/hr at 01/01/16 0531    PRN Meds: acetaminophen **OR** acetaminophen, ondansetron **OR** ondansetron (ZOFRAN) IV  Physical Exam        Well developed, HOH, pleasantly demented. CV RRR, Resp NAD Abd Soft, Nt   Vital Signs: BP 141/54 mmHg  Pulse 84  Temp(Src) 99.3 F (37.4 C) (Oral)  Resp 18  Ht 5\' 2"  (1.575 m)  Wt 58.196 kg (128 lb 4.8 oz)  BMI 23.46 kg/m2  SpO2 98% SpO2: SpO2: 98 % O2 Device: O2 Device: Not Delivered O2 Flow Rate: O2 Flow Rate (L/min): 3 L/min  Intake/output summary:  Intake/Output Summary (Last 24 hours) at 01/02/16 1201 Last data filed at 01/02/16 0900  Gross per 24 hour  Intake 357.08 ml  Output      0 ml  Net 357.08 ml   LBM: Last BM Date: 12/30/15 Baseline Weight: Weight: 58.196 kg (128 lb 4.8 oz) Most recent weight: Weight: 58.196 kg (128 lb 4.8 oz)       Palliative Assessment/Data:    Flowsheet Rows        Most Recent Value   Intake Tab  Referral Department  Hospitalist   Unit at Time of Referral  Med/Surg Unit   Palliative Care Primary Diagnosis  Other (Comment)   Date Notified  12/30/15   Palliative Care Type  New Palliative care   Date of Admission  12/29/15   # of days IP prior to Palliative referral  1   Clinical Assessment    Palliative Performance Scale Score  30%   Psychosocial & Spiritual Assessment    Palliative Care Outcomes    Patient/Family meeting held?  Yes   Who was at the meeting?  patient and daughter   Palliative Care Outcomes  Other (Comment)   Palliative Care follow-up planned  Yes, Home      Patient Active Problem List   Diagnosis Date Noted  . Palliative care encounter   . Goals of care, counseling/discussion   . DNR (do not resuscitate) discussion   . Melena 12/29/2015  . Hypertension 12/29/2015  . GI bleed 12/29/2015  . Oxygen desaturation during sleep   . Dementia     Palliative Care Assessment & Plan   Patient Profile: 82 yo demented female with hx of HTN.  Admitted for obs with melena.  Being discharged todays, but now having right knee pain.    Recommendations/Plan:  DNR / DNI  HHRN services.  Recommend Palliative outpatient follow up  Goals of Care and Additional Recommendations:  Limitations on Scope of Treatment: Minimize Medications  Code Status:DNR  Prognosis:  Difficult to determine.  Likely less than 12 months.  Discharge Planning:  Home with Home Health  Care plan was discussed with dtr and Dr. Blake Divine of [facility]  Thank you for allowing the Palliative Medicine Team to assist in the care of this patient.   Time In: 9:45 Time Out: 10:00 Total Time 15  min Prolonged Time Billed no      Greater than 50%  of this time was spent counseling and coordinating care related to the above assessment and plan.  Stephani Police, PA-C  Please contact Palliative Medicine Team phone at [phone] for questions and concerns.

## 2016-01-07 ENCOUNTER — Emergency Department (HOSPITAL_COMMUNITY): Payer: PPO

## 2016-01-07 ENCOUNTER — Inpatient Hospital Stay (HOSPITAL_COMMUNITY)
Admission: EM | Admit: 2016-01-07 | Discharge: 2016-01-11 | DRG: 175 | Disposition: A | Discharge status: Skilled Nursing Facility | Payer: PPO | Attending: Internal Medicine | Admitting: Internal Medicine

## 2016-01-07 ENCOUNTER — Encounter (HOSPITAL_COMMUNITY): Payer: Self-pay | Admitting: Emergency Medicine

## 2016-01-07 ENCOUNTER — Inpatient Hospital Stay (HOSPITAL_COMMUNITY): Payer: PPO

## 2016-01-07 DIAGNOSIS — L89619 Pressure ulcer of right heel, unspecified stage: Secondary | ICD-10-CM | POA: Diagnosis present

## 2016-01-07 DIAGNOSIS — R140 Abdominal distension (gaseous): Secondary | ICD-10-CM | POA: Diagnosis present

## 2016-01-07 DIAGNOSIS — F03918 Unspecified dementia, unspecified severity, with other behavioral disturbance: Secondary | ICD-10-CM

## 2016-01-07 DIAGNOSIS — B952 Enterococcus as the cause of diseases classified elsewhere: Secondary | ICD-10-CM | POA: Diagnosis present

## 2016-01-07 DIAGNOSIS — Z66 Do not resuscitate: Secondary | ICD-10-CM | POA: Diagnosis present

## 2016-01-07 DIAGNOSIS — N390 Urinary tract infection, site not specified: Secondary | ICD-10-CM | POA: Diagnosis present

## 2016-01-07 DIAGNOSIS — R197 Diarrhea, unspecified: Secondary | ICD-10-CM | POA: Diagnosis not present

## 2016-01-07 DIAGNOSIS — Z9181 History of falling: Secondary | ICD-10-CM | POA: Diagnosis not present

## 2016-01-07 DIAGNOSIS — F0391 Unspecified dementia with behavioral disturbance: Secondary | ICD-10-CM | POA: Diagnosis not present

## 2016-01-07 DIAGNOSIS — E43 Unspecified severe protein-calorie malnutrition: Secondary | ICD-10-CM | POA: Diagnosis not present

## 2016-01-07 DIAGNOSIS — B9561 Methicillin susceptible Staphylococcus aureus infection as the cause of diseases classified elsewhere: Secondary | ICD-10-CM | POA: Diagnosis not present

## 2016-01-07 DIAGNOSIS — R32 Unspecified urinary incontinence: Secondary | ICD-10-CM | POA: Diagnosis present

## 2016-01-07 DIAGNOSIS — F0390 Unspecified dementia without behavioral disturbance: Secondary | ICD-10-CM | POA: Diagnosis present

## 2016-01-07 DIAGNOSIS — Z7189 Other specified counseling: Secondary | ICD-10-CM | POA: Diagnosis not present

## 2016-01-07 DIAGNOSIS — I82402 Acute embolism and thrombosis of unspecified deep veins of left lower extremity: Secondary | ICD-10-CM | POA: Diagnosis not present

## 2016-01-07 DIAGNOSIS — M25569 Pain in unspecified knee: Secondary | ICD-10-CM

## 2016-01-07 DIAGNOSIS — M6289 Other specified disorders of muscle: Secondary | ICD-10-CM

## 2016-01-07 DIAGNOSIS — Z6823 Body mass index (BMI) 23.0-23.9, adult: Secondary | ICD-10-CM | POA: Diagnosis not present

## 2016-01-07 DIAGNOSIS — Z880 Allergy status to penicillin: Secondary | ICD-10-CM

## 2016-01-07 DIAGNOSIS — M25561 Pain in right knee: Secondary | ICD-10-CM | POA: Diagnosis not present

## 2016-01-07 DIAGNOSIS — Z7401 Bed confinement status: Secondary | ICD-10-CM

## 2016-01-07 DIAGNOSIS — A082 Adenoviral enteritis: Secondary | ICD-10-CM | POA: Diagnosis not present

## 2016-01-07 DIAGNOSIS — Z515 Encounter for palliative care: Secondary | ICD-10-CM | POA: Diagnosis not present

## 2016-01-07 DIAGNOSIS — K921 Melena: Secondary | ICD-10-CM | POA: Diagnosis not present

## 2016-01-07 DIAGNOSIS — K922 Gastrointestinal hemorrhage, unspecified: Secondary | ICD-10-CM | POA: Diagnosis present

## 2016-01-07 DIAGNOSIS — R339 Retention of urine, unspecified: Secondary | ICD-10-CM | POA: Diagnosis present

## 2016-01-07 DIAGNOSIS — I2699 Other pulmonary embolism without acute cor pulmonale: Secondary | ICD-10-CM | POA: Diagnosis not present

## 2016-01-07 DIAGNOSIS — I10 Essential (primary) hypertension: Secondary | ICD-10-CM | POA: Diagnosis present

## 2016-01-07 DIAGNOSIS — M25562 Pain in left knee: Secondary | ICD-10-CM | POA: Diagnosis present

## 2016-01-07 DIAGNOSIS — L89629 Pressure ulcer of left heel, unspecified stage: Secondary | ICD-10-CM | POA: Diagnosis not present

## 2016-01-07 DIAGNOSIS — L89610 Pressure ulcer of right heel, unstageable: Secondary | ICD-10-CM | POA: Diagnosis present

## 2016-01-07 DIAGNOSIS — R410 Disorientation, unspecified: Secondary | ICD-10-CM | POA: Diagnosis present

## 2016-01-07 DIAGNOSIS — E876 Hypokalemia: Secondary | ICD-10-CM | POA: Diagnosis not present

## 2016-01-07 DIAGNOSIS — R319 Hematuria, unspecified: Secondary | ICD-10-CM

## 2016-01-07 DIAGNOSIS — Z79899 Other long term (current) drug therapy: Secondary | ICD-10-CM | POA: Diagnosis not present

## 2016-01-07 DIAGNOSIS — L8990 Pressure ulcer of unspecified site, unspecified stage: Secondary | ICD-10-CM | POA: Diagnosis present

## 2016-01-07 DIAGNOSIS — L89620 Pressure ulcer of left heel, unstageable: Secondary | ICD-10-CM

## 2016-01-07 HISTORY — DX: Headache, unspecified: R51.9

## 2016-01-07 HISTORY — DX: Pressure ulcer of right heel, unstageable: L89.610

## 2016-01-07 HISTORY — DX: Depression, unspecified: F32.A

## 2016-01-07 HISTORY — DX: Headache: R51

## 2016-01-07 HISTORY — DX: Unspecified dementia, unspecified severity, with other behavioral disturbance: F03.918

## 2016-01-07 HISTORY — DX: Major depressive disorder, single episode, unspecified: F32.9

## 2016-01-07 HISTORY — DX: Gastro-esophageal reflux disease without esophagitis: K21.9

## 2016-01-07 HISTORY — DX: Unspecified dementia with behavioral disturbance: F03.91

## 2016-01-07 HISTORY — DX: Other specified disorders of muscle: M62.89

## 2016-01-07 HISTORY — DX: Retention of urine, unspecified: R33.9

## 2016-01-07 HISTORY — DX: Other pulmonary embolism without acute cor pulmonale: I26.99

## 2016-01-07 HISTORY — DX: Pressure ulcer of left heel, unstageable: L89.620

## 2016-01-07 HISTORY — DX: Personal history of other diseases of the digestive system: Z87.19

## 2016-01-07 HISTORY — DX: Unspecified severe protein-calorie malnutrition: E43

## 2016-01-07 LAB — URINALYSIS, ROUTINE W REFLEX MICROSCOPIC
BILIRUBIN URINE: NEGATIVE
GLUCOSE, UA: NEGATIVE mg/dL
KETONES UR: 15 mg/dL — AB
Nitrite: NEGATIVE
PROTEIN: NEGATIVE mg/dL
Specific Gravity, Urine: 1.041 — ABNORMAL HIGH (ref 1.005–1.030)
pH: 5.5 (ref 5.0–8.0)

## 2016-01-07 LAB — APTT: aPTT: 32 seconds (ref 24–37)

## 2016-01-07 LAB — GASTROINTESTINAL PANEL BY PCR, STOOL (REPLACES STOOL CULTURE)
Adenovirus F40/41: DETECTED — AB
Astrovirus: NOT DETECTED
CAMPYLOBACTER SPECIES: NOT DETECTED
CRYPTOSPORIDIUM: NOT DETECTED
CYCLOSPORA CAYETANENSIS: NOT DETECTED
E. coli O157: NOT DETECTED
ENTAMOEBA HISTOLYTICA: NOT DETECTED
ENTEROAGGREGATIVE E COLI (EAEC): NOT DETECTED
Enteropathogenic E coli (EPEC): NOT DETECTED
Enterotoxigenic E coli (ETEC): NOT DETECTED
GIARDIA LAMBLIA: NOT DETECTED
Norovirus GI/GII: NOT DETECTED
PLESIMONAS SHIGELLOIDES: NOT DETECTED
Rotavirus A: NOT DETECTED
SALMONELLA SPECIES: NOT DETECTED
SHIGELLA/ENTEROINVASIVE E COLI (EIEC): NOT DETECTED
Sapovirus (I, II, IV, and V): NOT DETECTED
Shiga like toxin producing E coli (STEC): NOT DETECTED
VIBRIO SPECIES: NOT DETECTED
Vibrio cholerae: NOT DETECTED
YERSINIA ENTEROCOLITICA: NOT DETECTED

## 2016-01-07 LAB — CBC WITH DIFFERENTIAL/PLATELET
BASOS ABS: 0 10*3/uL (ref 0.0–0.1)
Basophils Relative: 0 %
EOS PCT: 2 %
Eosinophils Absolute: 0.3 10*3/uL (ref 0.0–0.7)
HEMATOCRIT: 35.2 % — AB (ref 36.0–46.0)
Hemoglobin: 10.9 g/dL — ABNORMAL LOW (ref 12.0–15.0)
Lymphocytes Relative: 9 %
Lymphs Abs: 1.1 10*3/uL (ref 0.7–4.0)
MCH: 28.7 pg (ref 26.0–34.0)
MCHC: 31 g/dL (ref 30.0–36.0)
MCV: 92.6 fL (ref 78.0–100.0)
MONO ABS: 1.2 10*3/uL — AB (ref 0.1–1.0)
MONOS PCT: 9 %
NEUTROS ABS: 10.2 10*3/uL — AB (ref 1.7–7.7)
Neutrophils Relative %: 80 %
Platelets: 238 10*3/uL (ref 150–400)
RBC: 3.8 MIL/uL — AB (ref 3.87–5.11)
RDW: 14 % (ref 11.5–15.5)
WBC: 12.8 10*3/uL — ABNORMAL HIGH (ref 4.0–10.5)

## 2016-01-07 LAB — COMPREHENSIVE METABOLIC PANEL
ALBUMIN: 2.1 g/dL — AB (ref 3.5–5.0)
ALK PHOS: 86 U/L (ref 38–126)
ALT: 25 U/L (ref 14–54)
AST: 29 U/L (ref 15–41)
Anion gap: 10 (ref 5–15)
BILIRUBIN TOTAL: 0.5 mg/dL (ref 0.3–1.2)
BUN: 27 mg/dL — AB (ref 6–20)
CALCIUM: 8.6 mg/dL — AB (ref 8.9–10.3)
CO2: 24 mmol/L (ref 22–32)
CREATININE: 1 mg/dL (ref 0.44–1.00)
Chloride: 104 mmol/L (ref 101–111)
GFR calc Af Amer: 55 mL/min — ABNORMAL LOW (ref >60)
GFR calc non Af Amer: 48 mL/min — ABNORMAL LOW (ref >60)
GLUCOSE: 100 mg/dL — AB (ref 65–99)
Potassium: 3.9 mmol/L (ref 3.5–5.1)
SODIUM: 138 mmol/L (ref 135–145)
TOTAL PROTEIN: 5.8 g/dL — AB (ref 6.5–8.1)

## 2016-01-07 LAB — PREALBUMIN: Prealbumin: 5.3 mg/dL — ABNORMAL LOW (ref 18–38)

## 2016-01-07 LAB — POC OCCULT BLOOD, ED: Fecal Occult Bld: NEGATIVE

## 2016-01-07 LAB — URINE MICROSCOPIC-ADD ON

## 2016-01-07 LAB — TYPE AND SCREEN
ABO/RH(D): O POS
ANTIBODY SCREEN: NEGATIVE

## 2016-01-07 LAB — C DIFFICILE QUICK SCREEN W PCR REFLEX
C Diff antigen: NEGATIVE
C Diff interpretation: NEGATIVE
C Diff toxin: NEGATIVE

## 2016-01-07 LAB — TROPONIN I: Troponin I: 0.03 ng/mL (ref <0.031)

## 2016-01-07 LAB — PROTIME-INR
INR: 1.49 (ref 0.00–1.49)
PROTHROMBIN TIME: 18.1 s — AB (ref 11.6–15.2)

## 2016-01-07 MED ORDER — HALOPERIDOL LACTATE 5 MG/ML IJ SOLN
5.0000 mg | Freq: Four times a day (QID) | INTRAMUSCULAR | Status: DC | PRN
  Administered 2016-01-07: 5 mg via INTRAMUSCULAR
  Filled 2016-01-07: qty 1

## 2016-01-07 MED ORDER — ONDANSETRON HCL 4 MG PO TABS: 4.0000 mg | ORAL_TABLET | Freq: Four times a day (QID) | ORAL | Status: DC | PRN

## 2016-01-07 MED ORDER — HALOPERIDOL LACTATE 5 MG/ML IJ SOLN: 2.0000 mg | Freq: Four times a day (QID) | INTRAMUSCULAR | Status: DC | PRN

## 2016-01-07 MED ORDER — CEFTRIAXONE SODIUM 1 G IJ SOLR
1.0000 g | INTRAMUSCULAR | Status: DC
  Administered 2016-01-07 – 2016-01-08 (×2): 1 g via INTRAVENOUS
  Filled 2016-01-07 (×3): qty 10

## 2016-01-07 MED ORDER — SODIUM CHLORIDE 0.9 % IV SOLN
80.0000 mg | Freq: Every day | INTRAVENOUS | Status: DC
  Filled 2016-01-07: qty 80

## 2016-01-07 MED ORDER — LORAZEPAM 2 MG/ML IJ SOLN
1.0000 mg | Freq: Once | INTRAMUSCULAR | Status: AC
  Administered 2016-01-07: 1 mg via INTRAVENOUS
  Filled 2016-01-07: qty 1

## 2016-01-07 MED ORDER — APIXABAN 5 MG PO TABS
10.0000 mg | ORAL_TABLET | Freq: Two times a day (BID) | ORAL | Status: DC
Start: 2016-01-07 — End: 2016-01-07
  Administered 2016-01-07: 10 mg via ORAL
  Filled 2016-01-07 (×2): qty 2

## 2016-01-07 MED ORDER — IOPAMIDOL (ISOVUE-370) INJECTION 76%
INTRAVENOUS | Status: AC
  Administered 2016-01-07: 75 mL
  Filled 2016-01-07: qty 100

## 2016-01-07 MED ORDER — SODIUM CHLORIDE 0.9 % IV SOLN
INTRAVENOUS | Status: DC
  Administered 2016-01-07: 03:00:00 via INTRAVENOUS

## 2016-01-07 MED ORDER — ACETAMINOPHEN 325 MG PO TABS
650.0000 mg | ORAL_TABLET | Freq: Four times a day (QID) | ORAL | Status: DC | PRN
  Administered 2016-01-09 – 2016-01-11 (×3): 650 mg via ORAL
  Filled 2016-01-07 (×4): qty 2

## 2016-01-07 MED ORDER — HYDRALAZINE HCL 20 MG/ML IJ SOLN: 5.0000 mg | INTRAMUSCULAR | Status: DC | PRN

## 2016-01-07 MED ORDER — LORAZEPAM 2 MG/ML IJ SOLN
1.0000 mg | INTRAMUSCULAR | Status: DC | PRN
  Administered 2016-01-07 – 2016-01-09 (×2): 1 mg via INTRAVENOUS
  Filled 2016-01-07 (×2): qty 1

## 2016-01-07 MED ORDER — CIPROFLOXACIN IN D5W 400 MG/200ML IV SOLN
400.0000 mg | Freq: Once | INTRAVENOUS | Status: AC
  Administered 2016-01-07: 400 mg via INTRAVENOUS
  Filled 2016-01-07: qty 200

## 2016-01-07 MED ORDER — SODIUM CHLORIDE 0.9 % IV SOLN
INTRAVENOUS | Status: DC
  Administered 2016-01-07 – 2016-01-08 (×3): via INTRAVENOUS

## 2016-01-07 MED ORDER — ACETAMINOPHEN 650 MG RE SUPP: 650.0000 mg | Freq: Four times a day (QID) | RECTAL | Status: DC | PRN

## 2016-01-07 MED ORDER — PANTOPRAZOLE SODIUM 40 MG IV SOLR
8.0000 mg/h | INTRAVENOUS | Status: DC
  Filled 2016-01-07 (×2): qty 80

## 2016-01-07 MED ORDER — IOPAMIDOL (ISOVUE-300) INJECTION 61%
INTRAVENOUS | Status: AC
  Administered 2016-01-07: 100 mL
  Filled 2016-01-07: qty 100

## 2016-01-07 MED ORDER — DEXTROSE 5 % IV SOLN: 1.0000 g | INTRAVENOUS | Status: DC

## 2016-01-07 MED ORDER — HEPARIN (PORCINE) IN NACL 100-0.45 UNIT/ML-% IJ SOLN
900.0000 [IU]/h | INTRAMUSCULAR | Status: DC
  Administered 2016-01-07: 900 [IU]/h via INTRAVENOUS
  Filled 2016-01-07: qty 250

## 2016-01-07 MED ORDER — HEPARIN BOLUS VIA INFUSION
2500.0000 [IU] | Freq: Once | INTRAVENOUS | Status: AC
  Administered 2016-01-07: 2500 [IU] via INTRAVENOUS
  Filled 2016-01-07: qty 2500

## 2016-01-07 MED ORDER — OXYCODONE HCL 5 MG PO TABS
5.0000 mg | ORAL_TABLET | ORAL | Status: DC | PRN
  Administered 2016-01-10: 5 mg via ORAL
  Filled 2016-01-07: qty 1

## 2016-01-07 MED ORDER — APIXABAN 5 MG PO TABS: 5.0000 mg | ORAL_TABLET | Freq: Two times a day (BID) | ORAL | Status: DC

## 2016-01-07 MED ORDER — ONDANSETRON HCL 4 MG/2ML IJ SOLN: 4.0000 mg | Freq: Four times a day (QID) | INTRAMUSCULAR | Status: DC | PRN

## 2016-01-07 MED ORDER — SODIUM CHLORIDE 0.9% FLUSH
3.0000 mL | Freq: Two times a day (BID) | INTRAVENOUS | Status: DC
  Administered 2016-01-07 – 2016-01-10 (×3): 3 mL via INTRAVENOUS

## 2016-01-07 NOTE — ED Notes (Signed)
Called CT requesting CT prior to MRI

## 2016-01-07 NOTE — ED Notes (Signed)
Pt yelling out with daughter at bedside, pt anxious that she is falling.

## 2016-01-07 NOTE — Care Management Note (Signed)
Case Management Note  Patient Details  Name: Bridgette HabermannHelen S Strough MRN: [id number] Date of Birth: 06/14/1923  Subjective/Objective:                  82 y.o. female with medical history significant of hypertension and severe dementia. Of note patient was discharged from hospital on 12/29/2015 after admission for possible GI bleed, hypotension and mechanical fall. / From home with adult daughter.  Action/Plan: Follow for disposition needs. / Resumption of care with [facility] if pt returns home.    Expected Discharge Date:  01/10/16               Expected Discharge Plan:  Home w Home Health Services  In-House Referral:  NA  Discharge planning Services  CM Consult  Post Acute Care Choice:  Resumption of Svcs/PTA Provider Choice offered to:     DME Arranged:    DME Agency:     HH Arranged:  RN, PT, OT, Nurse's Aide, Social Work [employer] Agency:  [facility]  Status of Service:  In process, will continue to follow  Medicare Important Message Given:    Date Medicare IM Given:    Medicare IM give by:    Date Additional Medicare IM Given:    Additional Medicare Important Message give by:     If discussed at Long Length of Stay Meetings, dates discussed:    Additional Comments:  Oletta CohnWood, Blaise Grieshaber, RN 01/07/2016, 10:37 AM

## 2016-01-07 NOTE — ED Notes (Signed)
Per EMS they were called for "blood in stool" however upon arrival the chief compalint was right knee pain.  She fell two weeks ago but was able to walk on it until yesterday when she was not able to bear weight.  No deformity in knee noted.

## 2016-01-07 NOTE — Progress Notes (Signed)
Pt received from ED, she was sedated from earlier ativan dose administered in ED, but when moved to bed from stretcher she began shouting incomprehensible words and thrashing her arms, reaching for her IV sites and pulling off her nasal cannula.  After pt transferred/cleaned up she settled down and fell back to heavy sleep.  Charge nurse notified that pt will likely require a Recruitment consultantsafety sitter tonight.

## 2016-01-07 NOTE — ED Notes (Signed)
Family at bedside. 

## 2016-01-07 NOTE — ED Notes (Signed)
Pt pulled out IV

## 2016-01-07 NOTE — Progress Notes (Addendum)
ANTICOAGULATION CONSULT NOTE - Initial Consult  Pharmacy Consult for heparin Indication: pulmonary embolus  Allergies  Allergen Reactions  . Penicillins Anaphylaxis    Patient Measurements:   Heparin Dosing Weight: 58.2 kg  Vital Signs: Temp: 97.7 F (36.5 C) (06/14 0116) Temp Source: Axillary (06/14 0116) BP: 139/64 mmHg (06/14 0720) Pulse Rate: 101 (06/14 0720)  Labs:  Recent Labs  01/07/16 0135 01/07/16 0506  HGB 10.9*  --   HCT 35.2*  --   PLT 238  --   APTT 32  --   LABPROT 18.1*  --   INR 1.49  --   CREATININE 1.00  --   TROPONINI  --  0.03    Estimated Creatinine Clearance: 29 mL/min (by C-G formula based on Cr of 1).  Assessment: 82 yo f presenting with right knee pain s/p fall 2 weeks prior; also diarrhea with black stools (recent d/c for gi bleed)  PMH: dementia, HTN  AC: none pta - inadvertent finding of potential for PE found on CT abd, so CTA done showing multiple BL PE with no RHS  Renal: SCr 1  Neuro: MRI pending to evaluate for cauda equina   Heme: H&H 10.9/35.2, Plt 238  Goal of Therapy:  Heparin level 0.3-0.7 units/ml Monitor platelets by anticoagulation protocol: Yes   Plan:  Heparin bolus 2500 units followed by an infusion of 900 units/hr HL 1600 Daily HL, CBC Monitor for s/sx of bleeding F/U Oral AC plans  Isaac BlissMichael Imo Cumbie, PharmD, BCPS, [facility] Clinical Pharmacist Pager [phone] 01/07/2016 7:25 AM   ___________________________________________________ To d/c heparin  Initiate Apixaban 10 mg BID x 1 week, then 5 mg BID  Isaac BlissMichael Dayquan Buys, PharmD, BCPS, [facility]CC[facility] Clinical Pharmacist Pager [phone] 01/07/2016 9:26 AM

## 2016-01-07 NOTE — ED Notes (Signed)
Notified MD of adding nasal cannula for 02 sats dropping while sleeping.

## 2016-01-07 NOTE — ED Notes (Signed)
Daughter: Percell BostonJeanie Hamada states she is HCPOA, reports pt is full code, wishing everything to be done in case of code.

## 2016-01-07 NOTE — ED Notes (Signed)
Paged Dr Konrad DoloresMerrell regarding order for Heparin and eliquis for clarification.

## 2016-01-07 NOTE — H&P (Addendum)
History and Physical    Elizabeth HabermannHelen S Booth WUJ:[id number] DOB: 09/17/1922 DOA: 01/07/2016  PCP: Lillia MountainGRIFFIN,JOHN JOSEPH, MD Patient coming from: Home  Chief Complaint: knee pain and dark stools  HPI: Elizabeth HabermannHelen S Booth is a 82 y.o. female with medical history significant of hypertension and severe dementia. Of note patient was discharged from hospital on 12/29/2015 after admission for possible GI bleed, hypotension and mechanical fall.   Level V caveat applies as patient is severely demented and daughter seems to have limited capacity to provide reliable history.  Of note family at that time refused EGD to evaluate for upper GI bleed. Hemoglobin stabilized at a further malonic stools were noted therefore this workup was discontinued. Patient lives at home with daughter. At time of discharge patient was stated to be in fair amount of pain when she arrived home. EMS placed patient in her bed where she stayed until the following morning. Per report patient was normocephalic day able to ambulate and eat and dress self. However by 01/04/2016 patient again began complaining of diffuse musculoskeletal pain with focal knee pain on both right and left and stayed in bed most of the day. This has continued over the last 3 days with somewhat of a waxing waning nature. Patient at time has been fairly pain-free and able to be up and ambulating. Patient's daughter states that she used rubbing alcohol on her belly and knees to see if this would help. Patient has been intermittently incontinent of stool and urine. Of note stools were noted to be dark but patient was resumed on Pepto-Bismol after last discharge which daughter states does make her stool darker.    ED Course: Lengthy ED assessment and treatment in large part due to lack of family input and assistance in patient's care. Patient received fluid bolus, was noted to have greater than a liter of urine in her bladder and had a Foley placed. UTI noted and started on  ciprofloxacin. Hemoglobin was checked and was found to be stable with FOBT negative. CT scan of abdomen and pelvis and chest fairly unremarkable other than apparent pelvic floor dysfunction and CTA chest showing pulmonary emboli  Review of Systems: As per HPI otherwise 10 point review of systems negative.   Ambulatory Status: minimally ambulatory  Past Medical History  Diagnosis Date  . Hypertension   . Dementia     Past Surgical History  Procedure Laterality Date  . Cholecystectomy    . Appendectomy      Social History   Social History  . Marital Status: Widowed    Spouse Name: N/A  . Number of Children: N/A  . Years of Education: N/A   Occupational History  . Not on file.   Social History Main Topics  . Smoking status: Never Smoker   . Smokeless tobacco: Not on file  . Alcohol Use: No  . Drug Use: No  . Sexual Activity: Not on file   Other Topics Concern  . Not on file   Social History Narrative    Allergies  Allergen Reactions  . Penicillins Anaphylaxis    Family History  Problem Relation Age of Onset  . Stroke Mother   . Stroke Father     Prior to Admission medications   Medication Sig Start Date End Date Taking? Authorizing Provider  amLODipine (NORVASC) 5 MG tablet Take 1 tablet (5 mg total) by mouth daily. 01/01/16  Yes Kathlen ModyVijaya Akula, MD    Physical Exam: Filed Vitals:   01/07/16 0530 01/07/16 0720  01/07/16 0818 01/07/16 0849  BP: 109/46 139/64 107/90 126/99  Pulse: 86 101 104 107  Temp:      TempSrc:      Resp: SpO2: 97% 96% 95% 96%     General: Appears agitated lying in bed Eyes: No scleral injection PERRL, EOMI,  ENT: Very dry mucous membranes, poor dentition Neck:  no LAD, masses or thyromegaly Cardiovascular:  RRR,II/VI systolic murmur. 2+ pitting edema on the left the level of the knee with 1+ pitting edema on the right  Respiratory: CTA bilaterally, no w/r/r. Increased effort Abdomen:r soft, ntnd, NABS Skin:  no rash  or induration seen on limited exam Musculoskeletal:  grossly normal tone BUE/BLE, good ROM, no bony abnormality Psychiatric: A but O 0, follows basic commands, speaks incoherently oftentimes,  Neurologic: CN 2-12 grossly intact, moves all extremities in coordinated fashion, sensation intact  Labs on Admission: I have personally reviewed following labs and imaging studies  CBC:  Recent Labs Lab 01/01/16 1248 01/07/16 0135  WBC 9.0 12.8*  NEUTROABS  --  10.2*  HGB 10.5* 10.9*  HCT 33.3* 35.2*  MCV 93.0 92.6  PLT 226 238   Basic Metabolic Panel:  Recent Labs Lab 12/31/15 1458 01/07/16 0135  NA 136 138  K 4.1 3.9  CL 104 104  CO2 24 24  GLUCOSE 99 100*  BUN 18 27*  CREATININE 0.84 1.00  CALCIUM 8.0* 8.6*   GFR: Estimated Creatinine Clearance: 29 mL/min (by C-G formula based on Cr of 1). Liver Function Tests:  Recent Labs Lab 01/07/16 0135  AST 29  ALT 25  ALKPHOS 86  BILITOT 0.5  PROT 5.8*  ALBUMIN 2.1*   No results for input(s): LIPASE, AMYLASE in the last 168 hours. No results for input(s): AMMONIA in the last 168 hours. Coagulation Profile:  Recent Labs Lab 01/07/16 0135  INR 1.49   Cardiac Enzymes:  Recent Labs Lab 01/07/16 0506  TROPONINI 0.03   BNP (last 3 results) No results for input(s): PROBNP in the last 8760 hours. HbA1C: No results for input(s): HGBA1C in the last 72 hours. CBG: No results for input(s): GLUCAP in the last 168 hours. Lipid Profile: No results for input(s): CHOL, HDL, LDLCALC, TRIG, CHOLHDL, LDLDIRECT in the last 72 hours. Thyroid Function Tests: No results for input(s): TSH, T4TOTAL, FREET4, T3FREE, THYROIDAB in the last 72 hours. Anemia Panel: No results for input(s): VITAMINB12, FOLATE, FERRITIN, TIBC, IRON, RETICCTPCT in the last 72 hours. Urine analysis:    Component Value Date/Time   COLORURINE YELLOW 01/07/2016 0501   APPEARANCEUR TURBID* 01/07/2016 0501   LABSPEC 1.041* 01/07/2016 0501   PHURINE 5.5  01/07/2016 0501   GLUCOSEU NEGATIVE 01/07/2016 0501   HGBUR LARGE* 01/07/2016 0501   BILIRUBINUR NEGATIVE 01/07/2016 0501   KETONESUR 15* 01/07/2016 0501   PROTEINUR NEGATIVE 01/07/2016 0501   NITRITE NEGATIVE 01/07/2016 0501   LEUKOCYTESUR LARGE* 01/07/2016 0501    Creatinine Clearance: Estimated Creatinine Clearance: 29 mL/min (by C-G formula based on Cr of 1).  Sepsis Labs: (procalcitonin:4,lacticidven:4) ) Recent Results (from the past 240 hour(s))  C difficile quick scan w PCR reflex     Status: None   Collection Time: 01/07/16  5:01 AM  Result Value Ref Range Status   C Diff antigen NEGATIVE NEGATIVE Final   C Diff toxin NEGATIVE NEGATIVE Final   C Diff interpretation Negative for toxigenic C. difficile  Final     Radiological Exams on Admission: Ct Angio Chest Pe W/cm &/  or Wo Cm  01/07/2016  CLINICAL DATA:  Dementia and anxiety. Filling defect suggested in the right pulmonary artery on CT abdomen and pelvis. EXAM: CT ANGIOGRAPHY CHEST WITH CONTRAST TECHNIQUE: Multidetector CT imaging of the chest was performed using the standard protocol during bolus administration of intravenous contrast. Multiplanar CT image reconstructions and MIPs were obtained to evaluate the vascular anatomy. CONTRAST:  100 mL Isovue 370 COMPARISON:  CT abdomen and pelvis 01/07/2016 FINDINGS: Technically adequate study with good opacification of the central and segmental pulmonary arteries. Multiple filling defects are demonstrated in the right main pulmonary artery and within bilateral upper lobe and lower lobe segmental branches. Appearance is consistent with acute pulmonary embolus. No evidence of right heart strain. Normal heart size. Normal caliber thoracic aorta. Aortic calcification. Great vessel origins are patent. No aortic dissection. Coronary artery calcifications. Esophagus is decompressed. Small esophageal hiatal hernia. No significant lymphadenopathy in the chest. Evaluation of lungs is  limited due to motion artifact. Emphysematous changes in the lungs. Atelectasis in the lung bases. No pleural effusions. No pneumothorax. Included portions of the upper abdominal organs are grossly unremarkable. Degenerative changes in the spine. No destructive bone lesions. Review of the MIP images confirms the above findings. IMPRESSION: Examination is positive for multiple bilateral pulmonary emboli, including right lobar pulmonary artery embolus. No evidence of right heart strain. These results were called by telephone at the time of interpretation on 01/07/2016 at 7:02 am to PA. Misty Stanley , who verbally acknowledged these results. Electronically Signed   By: Burman Nieves M.D.   On: 01/07/2016 07:05   Ct Abdomen Pelvis W Contrast  01/07/2016  CLINICAL DATA:  Abdominal distention, black stools, diarrhea. Fall 2 weeks ago. EXAM: CT ABDOMEN AND PELVIS WITH CONTRAST TECHNIQUE: Multidetector CT imaging of the abdomen and pelvis was performed using the standard protocol following bolus administration of intravenous contrast. CONTRAST:  ISOVUE-300 IOPAMIDOL (ISOVUE-300) INJECTION 61% COMPARISON:  None. FINDINGS: Dependent atelectasis in the lung bases. Small esophageal hiatal hernia. Coronary artery calcifications. Pulmonary arteries are not entirely included within the field of view but there is suggestion of a filling defect in the right main pulmonary artery. Pulmonary embolus is not excluded. The liver, spleen, pancreas, adrenal glands, inferior vena cava, and retroperitoneal lymph nodes are unremarkable. Calcification of aorta without aneurysm. Left renal hydronephrosis with ureterectasis. No stones are identified. Possibly due to reflux. Non radiopaque stone or stricture could also account for this appearance. Stomach, small bowel, and colon are not abnormally distended. Contrast material flows through to the rectum without evidence of bowel obstruction. Diverticulosis of the colon without inflammatory  change. No free air or free fluid in the abdomen. Pelvis: The bladder is diffusely distended without wall thickening or filling defect. This may be physiologic or could indicate bowel obstruction or impaired bladder contraction. Uterus and ovaries are not enlarged. Calcifications in the uterus suggesting small fibroids. Appendix is not identified. Stool in the rectum with suggestion of pelvic floor descent. Contrast material in the gluteal crease likely due to incontinence. Degenerative changes in the spine. Old fractures of the left inferior pubic ramus. IMPRESSION: No evidence of bowel obstruction. Suggestion of pelvic floor descent and rectal incontinence. Diffusely fluid distended bladder. Left hydronephrosis and hydroureter. No stone identified. Possible reflux or obstruction due to an occult stone or stricture. Small esophageal hiatal hernia. Possible filling defect in the right main pulmonary artery could indicate pulmonary embolus. These results were called by telephone at the time of interpretation on 01/07/2016 at 3:38  am to Dr. Rochele Raring , who verbally acknowledged these results. Electronically Signed   By: Burman Nieves M.D.   On: 01/07/2016 03:44      Assessment/Plan Active Problems:   Hypertension   Dementia   DNR (do not resuscitate) discussion   Bilateral pulmonary embolism (HCC)   Urinary retention   Pelvic floor dysfunction   Dementia with behavioral disturbance   Knee pain, bilateral   Pulmonary embolus: Noted incidentally during workup. Suspect these come from patient's recent bedbound state. Started on heparin drip and ED. Unfortunately patient's daughter seems to have very little insight into the need for anticoagulation, risks and benefits, and various options to these were explained to the patient's daughter in great detail.  Patient with new found extensive lower extremity edema predominantly on the left suggesting left lower extremity DVT as a primary source for PE -  DC heparin drip - Start Eliquis  Urinary retention/UTI: UA grossly contaminated/infected though this was a or sample. Patient with greater than 1 L urinary continent of bladder. Suspect this is from pelvic floor dysfunction and mechanical obstruction. CT concerning for pelvic floor descent. WBC 12.8 w/ L shift. - Urine culture - DC ciprofloxacin (given high incidence of Cdiff in pt already w/ diarrhea and poor physical reserve) - Ceftriaxone (PCN allergy noted - pharmacy consulted) - Keep Foley catheter in place. Unfortunately this is likely to be a recurrent problem for patient from now until the day she dies. We can attempt to floor exercises but suspect the patient will be non-participatory given her mental status.  Knee pain: Patient seems to perseverate on bilaterally pain the predominantly complains of right knee pain since her mechanical fall and previous admission. Imaging was performed at time of initial complaint and was negative for acute process. At time of my examination I was able to manipulate the knee extensively bilaterally with no complaint of pain from the patient. This is best done when patient is distracted. No swelling is appreciated. - Repeat AP lateral knees bilaterally  Melanic stool: FOBT -. Recent admission for the same but family did not undergo EGD. On pepto bismul. Hgb stable at 10.9 this admission.  - CBC in am  Dementia: At baseline patient is able to dress self and eat and toilet that has limited understanding of surroundings, date, time, and persons. Currently patient is completely confused which is likely secondary to a UTI and other stresses caused by pulmonary embolus. - Ativan and Haldol when necessary agitation/delirium  Social: By far the patient's biggest area of concern is her social situation. Patient lives at home with her daughter who is unable to care for her. The daughter seems to have very little true insight into her mother's care in the severity of  medical decisions needing to be made to properly care for her. Lengthy discussion had with patient started regarding CODE STATUS and once again established that patient is DO NOT RESUSCITATE. Patient needs further discussions with palliative care and potentially hospice as I suspect her condition will only continue to deteriorate over the next several months likely relating to patient's ultimate demise. Of note palliative care was involved in patient's treatment at her last admission and recommended home health services and outpatient palliative care follow-up. - Consult palliative care for goals of care and possible hospice  Protein calorie malnutrition/physical deconditioning: Since last admission patient has been fairly bedbound. Suspect this is in part due to her mental status as well as recent hospitalization. Patient's albumin 2.1 with  total protein 5.8. Suspect or home nutrition. - PT/OT - Pre-albumin - Nutrition consult  Hypertension: Normotensive to hypotensive at time of admission - Restart Norvasc if pressures improved in a.m. - Hydralazine when necessary    DVT prophylaxis: Xarelto  Code Status: DNR  Family Communication: daughter  Disposition Plan: pending improvement and GOC discussions  Consults called: Palliative  Admission status: Inpatient to Telemetry    MERRELL, DAVID J MD [facility]  If 7PM-7AM, please contact night-coverage [email or web address] Password [email or web address]  01/07/2016, 9:02 AM

## 2016-01-07 NOTE — ED Notes (Signed)
Pt has small black bowel movement.  Pt cleaned, bed linens changed and blanket applied.  Pt is very agitated, reports to daughter that she is falling.

## 2016-01-07 NOTE — Progress Notes (Signed)
Attempted pt and pt is to confused and moving to much to get exam done at this time, pt was given haldol before arriving to MRI, pt continues to try to get out of bed

## 2016-01-07 NOTE — ED Notes (Signed)
Pt transported to and from MRI on stretcher with tech.

## 2016-01-07 NOTE — Consult Note (Signed)
Consultation Note Date: 01/07/2016   Patient Name: Elizabeth Booth  DOB: [birth year]  MRN: [id number]  Age / Sex: 82 y.o., female  PCP: Lavone Orn, MD Referring Physician: Waldemar Dickens, MD  Reason for Consultation: Establishing goals of care  HPI/Patient Profile: 82 y.o. female  with past medical history of dementia, hypertension, and concern for recent GI bleed.  She was admitted on 01/07/2016 with an inability to walk, delirium, and bilateral PE.   Of note Elizabeth Booth was just discharged from [facility] on 6/5.   Clinical Assessment and Goals of Care: I met with Elizabeth Booth and her daughter Elizabeth Booth) Elizabeth Booth at bedside in the ER.  Elizabeth Booth may be slightly learning disabled.  She has a difficult time grasping the concepts of Hospice and end of life.     The patient was delirious - picking invisible things out of the air, talking about her husband (who is diseased) and recently released [employer].    Her daughter Elizabeth Booth is frazzled.  She tells me that neither she nor her mother have slept in 3 days.  Mrs Elizabeth Booth has been screaming and is unable to walk.  She is incontinent of stool and urine.  She is up all night.  She complains bitterly of knee pain.  Elizabeth Booth can no longer take it she is at her wits end.  Elizabeth Booth does confide that she can't afford for her mother to go to a facility - her social security check is their only source of income.  After talking awhile, Elizabeth Booth calmed down.  She understands that her mother needs rehab and possibly hospice and she is agreeable to let her go to rehab.   She reconfirmed DNR.  We discussed GI bleeding and PEs.  We discussed anticoagulation.   I told Elizabeth Booth I am very concerned her mother will bleed when we start anticoagulation - and that this could be a big problem.  We discussed end of life.  I told Elizabeth Booth that we would try to correct her mother's delirium but if  we were unable to do so - she may be at end of life.  NEXT OF KIN:  Elizabeth Booth (Daughter)    SUMMARY OF RECOMMENDATIONS   DNR / DNI Haldol for delirium.   Agree with antibiotics for UTI On heparin for bilateral PEs.  I stopped eliquis this afternoon as nursing notes a black stool. I have asked nursing to continue to document stool color/consistency in the progress notes. Patient will likely need acute rehab vs hospice house at discharge.  Dtr is unable to care for her at home.   Code Status/Advance Care Planning:  DNR    Symptom Management:   Haldol for delirium.   If delirium persists may need to try a longer term remedy such as Zyprexa.  Palliative Prophylaxis:   Aspiration, Bowel Regimen, Delirium Protocol and Frequent Pain Assessment  Additional Recommendations (Limitations, Scope, Preferences):  Minimize Medications, Full scope treatment for now.  Psycho-social/Spiritual:   Desire for further Chaplaincy support:yes  Additional Recommendations: Caregiving  Support/Resources  Prognosis:   < 6 months  Discharge Planning: [facility] for rehab with Palliative care service follow-up      Primary Diagnoses: Present on Admission:  . Bilateral pulmonary embolism ([place]) . Dementia . Hypertension  I have reviewed the medical record, interviewed the patient and family, and examined the patient. The following aspects are pertinent.  Past Medical History  Diagnosis Date  . Hypertension   . Dementia    Social History   Social History  . Marital Status: Widowed    Spouse Name: N/A  . Number of Children: N/A  . Years of Education: N/A   Social History Main Topics  . Smoking status: Never Smoker   . Smokeless tobacco: None  . Alcohol Use: No  . Drug Use: No  . Sexual Activity: Not Asked   Other Topics Concern  . None   Social History Narrative   Family History  Problem Relation Age of Onset  . Stroke Mother   . Stroke Father    Scheduled  Meds: . sodium chloride flush  3 mL Intravenous Q12H   Continuous Infusions: . sodium chloride 50 mL/hr at 01/07/16 1647  . cefTRIAXone (ROCEPHIN)  IV Stopped (01/07/16 1557)   PRN Meds:.acetaminophen **OR** acetaminophen, haloperidol lactate, hydrALAZINE, LORazepam, ondansetron **OR** ondansetron (ZOFRAN) IV, oxyCODONE Medications Prior to Admission:  Prior to Admission medications   Medication Sig Start Date End Date Taking? Authorizing Provider  amLODipine (NORVASC) 5 MG tablet Take 1 tablet (5 mg total) by mouth daily. 01/01/16  Yes Hosie Poisson, MD   Allergies  Allergen Reactions  . Penicillins Anaphylaxis   Review of Systems:  Patient complains of pain in her knees and hips. Otherwise unable to participate in conversation  Physical Exam Elderly, frail female, delirious - picking things out of the air CV rrr Resp cta Abdomen:  Soft, nd Extremities:  Right knee swollen greater than left.   Vital Signs: BP 159/85 mmHg  Pulse 114  Temp(Src) 97.7 F (36.5 C) (Axillary)  Resp 25  SpO2 95% Pain Assessment: No/denies pain   Pain Score: 0-No pain   SpO2: SpO2: 95 % O2 Device:SpO2: 95 % O2 Flow Rate: .O2 Flow Rate (L/min): 2 L/min  IO: Intake/output summary:   Intake/Output Summary (Last 24 hours) at 01/07/16 1706 Last data filed at 01/07/16 1557  Gross per 24 hour  Intake    300 ml  Output   1200 ml  Net   -900 ml    LBM:   Baseline Weight:   Most recent weight:       Palliative Assessment/Data:   Flowsheet Rows        Most Recent Value   Intake Tab    Referral Department  Hospitalist   Unit at Time of Referral  ER   Palliative Care Primary Diagnosis  Other (Comment) [GI Bleed]   Date Notified  01/07/16   Palliative Care Type  Return patient Palliative Care   Reason for referral  Clarify Goals of Care   Date of Admission  01/07/16   Date first seen by Palliative Care  01/07/16   # of days Palliative referral response time  0 Day(s)   # of days IP prior  to Palliative referral  0   Clinical Assessment    Psychosocial & Spiritual Assessment    Palliative Care Outcomes       Time In: 3:00 Time Out: 4:10 Time Total: 70 min. Greater than 50%  of  this time was spent counseling and coordinating care related to the above assessment and plan.  Signed by: Imogene Burn, PA-C Palliative Medicine Pager: [phone]   Please contact Palliative Medicine Team phone at [phone] for questions and concerns.  For individual provider: See Shea Evans

## 2016-01-07 NOTE — ED Provider Notes (Addendum)
By signing my name below, I, Elizabeth Booth, attest that this documentation has been prepared under the direction and in the presence of [name], DO. Electronically Signed: [name], ED Scribe. 01/07/2016. 1:15 AM.  TIME SEEN: 1:15 AM  CHIEF COMPLAINT: Diarrhea, black stool  HPI: Brought in by EMS from home,  Bridgette HabermannHelen S Blackston is a 82 y.o. female with PMHx of dementia and HTN who presents to the Emergency Department foe evaluation of diarrhea and black stool. Patient was brought in by EMS from home. The pt was discharged from the hospital 6 days ago after being treated for a possible upper GI bleed. Had black stools at that time but was on Pepto-Bismol. She was guaiac positive. Was monitored in the hospital. Daughter did not want endoscopy. Black stools resolved prior to discharge. Per EMS report, the original call was for blood in her stool but on their arrival the patient complained of right knee pain. Did have mechanical fall prior to previous admission and had an x-ray of the right knee which showed no fracture, dislocation. The pt lives at home with her daughter.   Pt is a DNR per recent hospitalization and paperwork at bedside.   PCP is Dr. Valentina LucksGriffin  ROS:  Level V caveat for dementia  PAST MEDICAL HISTORY/PAST SURGICAL HISTORY:  Past Medical History  Diagnosis Date  . Hypertension   . Dementia     MEDICATIONS:  Prior to Admission medications   Medication Sig Start Date End Date Taking? Authorizing Provider  amLODipine (NORVASC) 5 MG tablet Take 1 tablet (5 mg total) by mouth daily. 01/01/16   Kathlen ModyVijaya Akula, MD  pantoprazole (PROTONIX) 40 MG tablet Take 1 tablet (40 mg total) by mouth 2 (two) times daily before a meal. 01/01/16   Kathlen ModyVijaya Akula, MD    ALLERGIES:  Allergies  Allergen Reactions  . Penicillins Anaphylaxis    SOCIAL HISTORY:  Social History  Substance Use Topics  . Smoking status: Never Smoker   . Smokeless tobacco: Not on file  . Alcohol Use: No     FAMILY HISTORY: No family history on file.  EXAM: BP 136/54 mmHg  Pulse 86  Temp(Src) 97.7 F (36.5 C) (Axillary)  SpO2 98% CONSTITUTIONAL: Alert but does not answer questions appropriately. Oriented to person only. Elderly, chronically ill-appearing HEAD: Normocephalic, atraumatic EYES: Conjunctivae clear, PERRL ENT: normal nose; no rhinorrhea; moist mucous membranes NECK: Supple, no meningismus, no LAD, no midline step-off or deformity  CARD: RRR; S1 and S2 appreciated; no murmurs, no clicks, no rubs, no gallops RESP: Normal chest excursion without splinting or tachypnea; breath sounds clear and equal bilaterally; no wheezes, no rhonchi, no rales, no hypoxia or respiratory distress, speaking full sentences ABD/GI: Normal bowel sounds; seems distended throughout the lower abdomen but soft, tender to palpation diffusely, no rebound, no guarding, no peritoneal signs RECTAL:  Normal rectal tone, patient hasgross blood but does have large amount of loose black stools, and stool is guaiac-negative however, nonthrombosed nonbleeding external hemorrhoids appreciated, nontender rectal exam, no fecal impaction BACK:  The back appears normal and is non-tender to palpation, there is no CVA tenderness, no midline step-off or deformity EXT: Normal ROM in all joints; non-tender to palpation; mild swelling to the right knee but no erythema or warmth, no edema; normal capillary refill; no cyanosis, no calf tenderness or swelling, 2+ DT pulses bilaterally, compartments are soft, no bony deformity on exam    SKIN: Normal color for age and race; warm; no rash NEURO:  Moves all extremities equally  MEDICAL DECISION MAKING: Patient here with diarrhea, black stools. Was recently admitted for black stools in the setting of using Pepto-Bismol. Is guaiac-negative today, hemodynamically stable. Abdomen seems distended and is diffusely tender. We'll obtain CT of her abdomen and pelvis, labs, urine, stool cultures.  Complains of knee pain but patient is demented and appears to be tender to palpation wherever you palpate her. I do not appreciate any obvious sign of trauma except some swelling of the right knee. No erythema or warmth to suggest septic arthritis. Recent x-ray of the knee showed effusion but no fracture dislocation. She has full range of motion in all of her joints and no obvious bony deformity. We have attempted to get in touch with her daughter several times at [phone] without success.  ED PROGRESS: 4:00 AM  Pt's CT of her abdomen and pelvis shows no bowel obstruction. She has diffusely distended bladder and enlargement of stool in the rectum. We'll give enema, place Foley catheter. She has full range of motion in all of her extremities. Denies any numbness but exam is very limited based on her dementia. She denies any pain currently. Unable to determine if patient could have cauda equina.  It appears patient's daughter has not wanted large workups in the past and she is a DO NOT RESUSCITATE. We will continue to try to get in touch with patient's daughter.   Radiology, Dr. Andria Meuse, concern that there is a filling defect in the right main pulmonary artery that could be PE. We'll obtain a CT of her chest for further evaluation. She again denies chest pain or shortness of breath but again limited secondary to dementia. No hypoxia, hypotension, tachycardia.    5:00 AM  Pt had over 1 L of urine in her bladder after Foley catheter placed. Still actively draining. Please have been sent to family's house to try to get in touch with patient's daughter.    5:25 AM  Pt's daughter Elizabeth Booth who lives with patient finally answered phone after police woke her up at her house.  She reports that she is patient's primary caregiver and has been living with her for the past 13 years. She states that patient fell 2 weeks ago. Has not been ambulating normally since. She normally able to ambulate without walker, cane.  States that she has been "leaking stool" for years. No known history of urinary incontinence. She states she has been urinating in the bed at home. She was not aware of any urinary retention. She states patient has intermittent complaint of back pain but none recently that she remembers. Discussed at length the patient's daughter concern for possible cauda equina given she is not walking, has fecal incontinence and urinary retention. At this time daughter would want sedation, MRI of her spine and possible surgery if needed. It has been explained to patient's daughter at length that she would likely be a poor operative candidate given her age, dementia and other medical comorbidities. Her daughter would still like to proceed with imaging of her lumbar spine. We'll give IV Ativan and attempt to obtain MRI today.   Daughter does report that they have been giving her Pepto-Bismol again which is likely the cause of her black stools. Daughter did not see any bright red blood per rectum. Stated that she had dark stools tonight in the bed which is why the daughter called EMS.  Patient has been guaiac-negative in the emergency department.   5:45 AM  Pt is becoming  more and more agitated which is likely her dementia. We have ordered IV Ativan. Her daughter did state that she was coming to the emergency department upon my request. I will consult case management and social work as well to help with disposition needs. I'm concerned that there may be some unintentional neglect from her daughter. If patient's MRI of her lumbar spine is negative, CT of her chest is negative, she will be discharged home although she may be better served in a nursing facility.    7:30 AM  Pt's daughter is now at bedside. Have had another lengthy discussion with her that patient would likely need sedation and intubation before she could tolerate an MRI. She is very agitated right now which I think is because of her dementia. No improvement  with IV Ativan. Discussed with patient's daughter that given her fecal incontinence has been present for "years" and we are not sure how long she has had urinary retention and it appears she has not been ambulate normally for 2 weeks that if this was cauda equina she likely has deficits that may be permanent given the length of time she has been symptomatic. I think that there needs to be a longer discussion with patient's daughter about risks versus benefits with proceeding with workup for this as I do not feel patient would do well with this surgery or have much benefit if she did have cauda equina. She is getting treated for her UTI with IV Cipro. Has a reported history of anaphylactic reaction to penicillin. I do not think that she could swallow pills at this time as she is very agitated. Urine cultures pending. Her C. difficile studies are negative. Other stool cultures are pending. CT of her chest shows multiple bilateral pulmonary emboli. She still hemodynamically stable without hypoxia. We have ordered IV heparin. Patient's daughter still reports she is a DO NOT RESUSCITATE.   Discussed patient's case with hospitalist, Dr. Konrad Dolores.  Recommend admission to inpatient, stepdown bed.  I will place holding orders per their request. Patient and family (if present) updated with plan. Care transferred to hospitalist service.  I reviewed all nursing notes, vitals, pertinent old records, EKGs, labs, imaging (as available).    EKG Interpretation  Date/Time:  Wednesday January 07 2016 04:29:40 EDT Ventricular Rate:  85 PR Interval:  142 QRS Duration: 99 QT Interval:  364 QTC Calculation: 433 R Axis:   -10 Text Interpretation:  Sinus rhythm Abnormal R-wave progression, early transition Left ventricular hypertrophy No significant change since last tracing Confirmed by WARD,  DO, KRISTEN ([id number]) on 01/07/2016 4:34:43 AM        I personally performed the services described in this documentation, which was  scribed in my presence. The recorded information has been reviewed and is accurate.      Layla Maw Ward, DO 01/07/16 0739   8:00 AM  Pt's Daughter has now decided to make patient a full code. Have had lengthy discussion with patient's daughter that she would likely not survive a cardiac arrest, respiratory arrest and that if she did her quality of life would be incredibly poor. Discussed with her that we would fracture ribs, sternum during CPR. Have discussed with her that I recommend hospice and palliative care be involved in that it should be about quality of life and not quantity. At this time patient's daughter would like to think about this more but would like to keep her a full code. Hospitalist has been updated.  Layla Maw Ward,  DO 01/07/16 0805

## 2016-01-07 NOTE — ED Notes (Signed)
Patient transported to CT 

## 2016-01-07 NOTE — ED Notes (Signed)
Spoke to Dr. Elesa MassedWard, instructed to hold on enema until tests are completed.  Give Ativan now, Daughter is on the way in.

## 2016-01-07 NOTE — ED Notes (Signed)
Pt. Transported to CT at this time.  

## 2016-01-07 NOTE — ED Notes (Signed)
Attempted to call daughter number again, no answer

## 2016-01-07 NOTE — Progress Notes (Signed)
Per report from ED RN, pt's daughter states that she wants all measures taken in the event of a code.  Pt chart states that she is a DNR.  At this time we don't have any documentation signed by pt.  Daughter is not present at this time, but we will readdress when she is at bedside.

## 2016-01-07 NOTE — ED Notes (Signed)
Pt had medium BM, nurse was notified.

## 2016-01-08 ENCOUNTER — Encounter (HOSPITAL_COMMUNITY): Payer: PPO

## 2016-01-08 DIAGNOSIS — A082 Adenoviral enteritis: Secondary | ICD-10-CM | POA: Diagnosis not present

## 2016-01-08 DIAGNOSIS — N390 Urinary tract infection, site not specified: Secondary | ICD-10-CM | POA: Diagnosis present

## 2016-01-08 DIAGNOSIS — K922 Gastrointestinal hemorrhage, unspecified: Secondary | ICD-10-CM

## 2016-01-08 DIAGNOSIS — R410 Disorientation, unspecified: Secondary | ICD-10-CM | POA: Diagnosis not present

## 2016-01-08 DIAGNOSIS — Z7189 Other specified counseling: Secondary | ICD-10-CM | POA: Diagnosis present

## 2016-01-08 DIAGNOSIS — E43 Unspecified severe protein-calorie malnutrition: Secondary | ICD-10-CM

## 2016-01-08 DIAGNOSIS — I2699 Other pulmonary embolism without acute cor pulmonale: Secondary | ICD-10-CM | POA: Diagnosis not present

## 2016-01-08 DIAGNOSIS — Z515 Encounter for palliative care: Secondary | ICD-10-CM | POA: Diagnosis not present

## 2016-01-08 DIAGNOSIS — N3000 Acute cystitis without hematuria: Secondary | ICD-10-CM

## 2016-01-08 DIAGNOSIS — R339 Retention of urine, unspecified: Secondary | ICD-10-CM | POA: Diagnosis not present

## 2016-01-08 DIAGNOSIS — F0391 Unspecified dementia with behavioral disturbance: Secondary | ICD-10-CM | POA: Diagnosis not present

## 2016-01-08 DIAGNOSIS — L8990 Pressure ulcer of unspecified site, unspecified stage: Secondary | ICD-10-CM | POA: Diagnosis present

## 2016-01-08 HISTORY — DX: Unspecified severe protein-calorie malnutrition: E43

## 2016-01-08 LAB — BASIC METABOLIC PANEL
ANION GAP: 10 (ref 5–15)
BUN: 18 mg/dL (ref 6–20)
CALCIUM: 8.4 mg/dL — AB (ref 8.9–10.3)
CHLORIDE: 107 mmol/L (ref 101–111)
CO2: 24 mmol/L (ref 22–32)
Creatinine, Ser: 0.83 mg/dL (ref 0.44–1.00)
GFR calc non Af Amer: 60 mL/min — ABNORMAL LOW (ref >60)
Glucose, Bld: 83 mg/dL (ref 65–99)
POTASSIUM: 3.9 mmol/L (ref 3.5–5.1)
Sodium: 141 mmol/L (ref 135–145)

## 2016-01-08 LAB — CBC
HCT: 36.4 % (ref 36.0–46.0)
HEMOGLOBIN: 10.9 g/dL — AB (ref 12.0–15.0)
MCH: 28.8 pg (ref 26.0–34.0)
MCHC: 29.9 g/dL — ABNORMAL LOW (ref 30.0–36.0)
MCV: 96 fL (ref 78.0–100.0)
Platelets: 262 10*3/uL (ref 150–400)
RBC: 3.79 MIL/uL — AB (ref 3.87–5.11)
RDW: 14.3 % (ref 11.5–15.5)
WBC: 9.4 10*3/uL (ref 4.0–10.5)

## 2016-01-08 LAB — OCCULT BLOOD X 1 CARD TO LAB, STOOL: Fecal Occult Bld: NEGATIVE

## 2016-01-08 MED ORDER — HALOPERIDOL LACTATE 5 MG/ML IJ SOLN
0.5000 mg | Freq: Four times a day (QID) | INTRAMUSCULAR | Status: DC | PRN
  Administered 2016-01-10: 0.5 mg via INTRAMUSCULAR
  Filled 2016-01-08: qty 1

## 2016-01-08 MED ORDER — APIXABAN 5 MG PO TABS: 5.0000 mg | ORAL_TABLET | Freq: Two times a day (BID) | ORAL | Status: DC

## 2016-01-08 MED ORDER — ENSURE ENLIVE PO LIQD
237.0000 mL | Freq: Three times a day (TID) | ORAL | Status: DC
  Administered 2016-01-08 – 2016-01-11 (×7): 237 mL via ORAL

## 2016-01-08 MED ORDER — OLANZAPINE 5 MG PO TABS
5.0000 mg | ORAL_TABLET | Freq: Every day | ORAL | Status: DC
  Administered 2016-01-08 – 2016-01-10 (×3): 5 mg via ORAL
  Filled 2016-01-08 (×4): qty 1

## 2016-01-08 MED ORDER — APIXABAN 5 MG PO TABS
10.0000 mg | ORAL_TABLET | Freq: Two times a day (BID) | ORAL | Status: DC
  Filled 2016-01-08: qty 2

## 2016-01-08 NOTE — Consult Note (Signed)
   [facility]   01/08/2016  Bridgette HabermannHelen S Booth 10/09/1922 [id number]  Spoke with inpatient RNCM regarding patient as a [employer] member.  Patient screened for potential [facility] Care Management services. Patient is being referred to palliative care for consultation as well.  [facility] and chart review reveals patient is a [age] y.o. female with medical history significant of hypertension and severe dementia. Of note patient was discharged from hospital on 12/29/2015 after admission for possible GI bleed, hypotension and mechanical fall.  Will follow chart review of palliative care for outcomes and any [facility] Care [facility] community needs. For questions contact:   Charlesetta ShanksVictoria Ludella Pranger, RN BSN CCM [facility]ia[facility]  [phone] business mobile phone Toll free office [phone]

## 2016-01-08 NOTE — Consult Note (Signed)
WOC wound consult note Reason for Consult: evaluate pressure injury Patient's family at bedside who gives history that she has been immobile recently. Patient does yell in pain with my touching the areas  Wound type: Unstageable pressure injuries bilateral achilles area Pressure Ulcer POA: Yes Measurement: 1cm x 1cm x 0cm right and left heels Wound bed: 100% dark, eschar Drainage (amount, consistency, odor) none Periwound: intact, but tender Dressing procedure/placement/frequency: Paint ulcers with betadine swab and allow to air dry. Prevalon boots for offloading at all times.   Discussed POC with patient and bedside nurse.  Re consult if needed, will not follow at this time. Thanks  Aunesti Pellegrino [employer] RN, CWOCN [phone])

## 2016-01-08 NOTE — Progress Notes (Addendum)
PT Cancellation Note  Patient Details Name: Elizabeth HabermannHelen S Beckel MRN: [id number] DOB: 02/12/1923   Cancelled Treatment:    Reason Eval/Treat Not Completed: Medical issues which prohibited therapy (Palliative meeting planned.  Pt anticoagulants stopped.  )Noted pt with possible DVT and multiple PEs.  Per nurse, pt confused and lethargic with delirium when she is awake.  Per nurse, HOLD PT and will await MD clarification of desired mobility.  Thanks.    Tawni MillersWhite, Kiaraliz Rafuse F 01/08/2016, 10:52 AM Eber Jonesawn Morio Widen,PT Acute Rehabilitation [phone] (pager)

## 2016-01-08 NOTE — Progress Notes (Signed)
OT Cancellation Note  Patient Details Name: Elizabeth Booth MRN: [id number] DOB: 05/04/1923   Cancelled Treatment:    Reason Eval/Treat Not Completed: Patient not medically ready. Pt with multiple PEs and anticoagulation stopped due to hematoma per RN, pallative care involved--will await clarification of desired mobility. Per nurse pt lethargic and with delrium when she is awake.   Evette GeorgesLeonard, Masiah Woody Eva [phone] 01/08/2016, 10:51 AM

## 2016-01-08 NOTE — Progress Notes (Signed)
ANTICOAGULATION CONSULT NOTE - Follow Up Consult  Pharmacy Consult for eliquis Indication: PE  Allergies  Allergen Reactions  . Penicillins Anaphylaxis    Patient Measurements:    Vital Signs: Temp: 97.8 F (36.6 C) (06/15 0439) Temp Source: Oral (06/15 0439) BP: 122/78 mmHg (06/15 0439) Pulse Rate: 89 (06/14 2137)  Labs:  Recent Labs  01/07/16 0135 01/07/16 0506 01/08/16 0348  HGB 10.9*  --  10.9*  HCT 35.2*  --  36.4  PLT 238  --  262  APTT 32  --   --   LABPROT 18.1*  --   --   INR 1.49  --   --   CREATININE 1.00  --  0.83  TROPONINI  --  0.03  --     Estimated Creatinine Clearance: 34.9 mL/min (by C-G formula based on Cr of 0.83).   Assessment: [age] YOF recent hospitalization 12/29/15-01/01/16 for evaluation of melena, now found to have acute PE, started eliquis 6/14, received 1 dose then d/c'd d/t ongoing black stools. Hgb stable - restart eliquis now.   Goal of Therapy:  Monitor platelets by anticoagulation protocol: Yes   Plan:  Eliquis 10 mg BID x 1 week then 5 mg BID from 6/22 CBC q72h Monitor for s/sx of bleeding  Riki RuskBell, Dayzee Trower Wang 01/08/2016,8:43 AM

## 2016-01-08 NOTE — Progress Notes (Signed)
Progress Note    Elizabeth HabermannHelen S Fredlund  WJX:[id number] DOB: 10/12/1922  DOA: 01/07/2016 PCP: Lillia MountainGRIFFIN,JOHN JOSEPH, MD    Brief Narrative:   Elizabeth Booth is an 82 y.o. female with a PMH of hypertension and severe dementia, recent hospitalization 12/29/15-01/01/16 for evaluation of melena (family refused EGD during that visit, hemoglobin stabilized without further melanotic stools and therefore she was discharged), who was readmitted 01/07/16 with a chief complaint of dark stools and knee pain. Of note, she was FOBT negative (had been taking Pepto Bismol), and CT scan of abdomen and pelvis and chest fairly unremarkable other than apparent pelvic floor dysfunction and CTA chest showing pulmonary emboli.  Assessment/Plan:   Principal Problem:   Bilateral pulmonary embolism (HCC) with probable DVT resulting in knee pain Suspect these come from patient's recent bedbound state. Started on heparin drip and ED. Unfortunately patient's daughter seems to have very little insight into the need for anticoagulation, risks and benefits, and various options to these were explained to the patient's daughter in great detail. Patient with new found extensive lower extremity edema predominantly on the left suggesting left lower extremity DVT as a primary source for PE. Initially placed on heparin drip, but this was changed to Eliquis.  Eliquis subsequently discontinued due to ongoing black stools. Hemoglobin essentially stable, But would hold Eliquis given ongoing black liquid stools.  Active Problems:   Melena/black diarrhea Repeat fecal occult blood testing. Monitor hemoglobin/hematocrit. C. difficile negative. GI pathogen panel positive for enteric adenovirus. Continue supportive care and contact precautions.    Severe malnutrition Evaluated by dietitian.    Hypertension Hydralazine IV ordered as needed.    Dementia with behavioral disturbance/delirium Advanced. On Zyprexa and Ativan. Also on Haldol as needed  for agitation. Reduce dose.    DNR (do not resuscitate) discussion The patient's daughter apparently has been inconsistent and changes her mind frequently with regard to the patient's CODE STATUS. May need an ethics consultation and social worker consult given her complex social situation.    Urinary retention/Pelvic floor dysfunction Catheter in place.    Knee pain Likely from DVT. Unfortunately, not a good candidate for anticoagulation with probable bloody stools.    Pressure ulcer Wound care consultation requested.    UTI Continue Rocephin. Follow-up cultures.   Family Communication/Anticipated D/C date and plan/Code Status   DVT prophylaxis: SCDs ordered. Code Status: DNR  Family Communication: No family currently at the bedside. Disposition Plan: Unclear, may benefit from SNF or residential hospice. Daughter does not seem able to care for her adequately in the home.   Medical Consultants:    Palliative Care   Procedures:    Anti-Infectives:   Rocephin 01/07/16--->   Subjective:   Elizabeth Booth is unable to verbalize to me.  She is lying in bed with deep inspirations, opens eyes when stimulated, but is unable to answer any questions. She has been incontinent of dark colored liquid stool.  Objective:    Filed Vitals:   01/07/16 1654 01/07/16 1730 01/07/16 2137 01/08/16 0439  BP:  117/44 127/78 122/78  Pulse:  88 89   Temp:   97.7 F (36.5 C) 97.8 F (36.6 C)  TempSrc:   Oral Oral  Resp:   18 18  SpO2: 95% 94% 98% 91%    Intake/Output Summary (Last 24 hours) at 01/08/16 0817 Last data filed at 01/08/16 0600  Gross per 24 hour  Intake    300 ml  Output    400 ml  Net   -100 ml   There were no vitals filed for this visit.  Exam: General exam: Somnolent, ill appearing.  Respiratory system: Clear to auscultation. Respiratory effort increased with deep inspirations. Cardiovascular system: S1 & S2 heard, RRR. No JVD,  rubs, gallops or clicks. No  murmurs. Gastrointestinal system: Abdomen is nondistended, soft and nontender. No organomegaly or masses felt. Normal bowel sounds heard. Lying in dark, liquid stools. Central nervous system: Alert and oriented. No focal neurological deficits. Extremities: No clubbing, edema, or cyanosis. Skin: Large eccymosis posterior right thigh. Psychiatry: Somnolent, unable to assess.   Data Reviewed:   I have personally reviewed following labs and imaging studies:  Labs: Basic Metabolic Panel:  Recent Labs Lab 01/07/16 0135 01/08/16 0348  NA 138 141  K 3.9 3.9  CL 104 107  CO2 24 24  GLUCOSE 100* 83  BUN 27* 18  CREATININE 1.00 0.83  CALCIUM 8.6* 8.4*   GFR Estimated Creatinine Clearance: 34.9 mL/min (by C-G formula based on Cr of 0.83). Liver Function Tests:  Recent Labs Lab 01/07/16 0135  AST 29  ALT 25  ALKPHOS 86  BILITOT 0.5  PROT 5.8*  ALBUMIN 2.1*   Coagulation profile  Recent Labs Lab 01/07/16 0135  INR 1.49    CBC:  Recent Labs Lab 01/01/16 1248 01/07/16 0135 01/08/16 0348  WBC 9.0 12.8* 9.4  NEUTROABS  --  10.2*  --   HGB 10.5* 10.9* 10.9*  HCT 33.3* 35.2* 36.4  MCV 93.0 92.6 96.0  PLT 226 238 262   Cardiac Enzymes:  Recent Labs Lab 01/07/16 0506  TROPONINI 0.03   Urine analysis:    Component Value Date/Time   COLORURINE YELLOW 01/07/2016 0501   APPEARANCEUR TURBID* 01/07/2016 0501   LABSPEC 1.041* 01/07/2016 0501   PHURINE 5.5 01/07/2016 0501   GLUCOSEU NEGATIVE 01/07/2016 0501   HGBUR LARGE* 01/07/2016 0501   BILIRUBINUR NEGATIVE 01/07/2016 0501   KETONESUR 15* 01/07/2016 0501   PROTEINUR NEGATIVE 01/07/2016 0501   NITRITE NEGATIVE 01/07/2016 0501   LEUKOCYTESUR LARGE* 01/07/2016 0501   Microbiology Recent Results (from the past 240 hour(s))  C difficile quick scan w PCR reflex     Status: None   Collection Time: 01/07/16  5:01 AM  Result Value Ref Range Status   C Diff antigen NEGATIVE NEGATIVE Final   C Diff toxin NEGATIVE  NEGATIVE Final   C Diff interpretation Negative for toxigenic C. difficile  Final  Gastrointestinal Panel by PCR , Stool     Status: Abnormal   Collection Time: 01/07/16  5:01 AM  Result Value Ref Range Status   Campylobacter species NOT DETECTED NOT DETECTED Final   Plesimonas shigelloides NOT DETECTED NOT DETECTED Final   Salmonella species NOT DETECTED NOT DETECTED Final   Yersinia enterocolitica NOT DETECTED NOT DETECTED Final   Vibrio species NOT DETECTED NOT DETECTED Final   Vibrio cholerae NOT DETECTED NOT DETECTED Final   Enteroaggregative E coli (EAEC) NOT DETECTED NOT DETECTED Final   Enteropathogenic E coli (EPEC) NOT DETECTED NOT DETECTED Final   Enterotoxigenic E coli (ETEC) NOT DETECTED NOT DETECTED Final   Shiga like toxin producing E coli (STEC) NOT DETECTED NOT DETECTED Final   E. coli O157 NOT DETECTED NOT DETECTED Final   Shigella/Enteroinvasive E coli (EIEC) NOT DETECTED NOT DETECTED Final   Cryptosporidium NOT DETECTED NOT DETECTED Final   Cyclospora cayetanensis NOT DETECTED NOT DETECTED Final   Entamoeba histolytica NOT DETECTED NOT DETECTED Final   Giardia lamblia NOT  DETECTED NOT DETECTED Final   Adenovirus F40/41 DETECTED (A) NOT DETECTED Final   Astrovirus NOT DETECTED NOT DETECTED Final   Norovirus GI/GII NOT DETECTED NOT DETECTED Final   Rotavirus A NOT DETECTED NOT DETECTED Final   Sapovirus (I, II, IV, and V) NOT DETECTED NOT DETECTED Final    Radiology: Dg Knee 1-2 Views Left  01/07/2016  CLINICAL DATA:  Pain EXAM: LEFT KNEE - 1-2 VIEW COMPARISON:  None. FINDINGS: Frontal and lateral views were obtained. There is no fracture or dislocation. There is a joint effusion. There is soft rather marked joint space narrowing medially. Other joint spaces appear normal. There are multiple foci of chondrocalcinosis. There is a small spur along the anterior superior patella. IMPRESSION: Osteoarthritic change with rather marked narrowing medially. Small joint effusion.  Chondrocalcinosis is present. Chondrocalcinosis may be seen with osteoarthritis but also may be indicative of calcium pyrophosphate deposition disease. Electronically Signed   By: Bretta Bang III M.D.   On: 01/07/2016 09:33   Dg Knee 1-2 Views Right  01/07/2016  CLINICAL DATA:  Knee pain bilaterally.  Dementia. EXAM: RIGHT KNEE - 1-2 VIEW COMPARISON:  12/29/2015 FINDINGS: Knee joint effusion is moderate and has decreased from prior. No fracture deformity or subluxation. Advanced knee osteoarthritis with near bone-on-bone contact at the medial compartment. Spurring is diffuse and bulky. Subchondral cyst at the tibial eminence. Nonspecific subcutaneous reticulation of the leg that is increased. Chondrocalcinosis and osteopenia. IMPRESSION: 1. No acute osseous finding. 2. Knee joint effusion that is decreased from 12/29/2015. 3. Increased but nonspecific leg edema. 4. Advanced tricompartmental osteoarthritis. Electronically Signed   By: Marnee Spring M.D.   On: 01/07/2016 09:32   Ct Angio Chest Pe W/cm &/or Wo Cm  01/07/2016  CLINICAL DATA:  Dementia and anxiety. Filling defect suggested in the right pulmonary artery on CT abdomen and pelvis. EXAM: CT ANGIOGRAPHY CHEST WITH CONTRAST TECHNIQUE: Multidetector CT imaging of the chest was performed using the standard protocol during bolus administration of intravenous contrast. Multiplanar CT image reconstructions and MIPs were obtained to evaluate the vascular anatomy. CONTRAST:  100 mL Isovue 370 COMPARISON:  CT abdomen and pelvis 01/07/2016 FINDINGS: Technically adequate study with good opacification of the central and segmental pulmonary arteries. Multiple filling defects are demonstrated in the right main pulmonary artery and within bilateral upper lobe and lower lobe segmental branches. Appearance is consistent with acute pulmonary embolus. No evidence of right heart strain. Normal heart size. Normal caliber thoracic aorta. Aortic calcification. Great  vessel origins are patent. No aortic dissection. Coronary artery calcifications. Esophagus is decompressed. Small esophageal hiatal hernia. No significant lymphadenopathy in the chest. Evaluation of lungs is limited due to motion artifact. Emphysematous changes in the lungs. Atelectasis in the lung bases. No pleural effusions. No pneumothorax. Included portions of the upper abdominal organs are grossly unremarkable. Degenerative changes in the spine. No destructive bone lesions. Review of the MIP images confirms the above findings. IMPRESSION: Examination is positive for multiple bilateral pulmonary emboli, including right lobar pulmonary artery embolus. No evidence of right heart strain. These results were called by telephone at the time of interpretation on 01/07/2016 at 7:02 am to PA. Misty Stanley , who verbally acknowledged these results. Electronically Signed   By: Burman Nieves M.D.   On: 01/07/2016 07:05   Ct Abdomen Pelvis W Contrast  01/07/2016  CLINICAL DATA:  Abdominal distention, black stools, diarrhea. Fall 2 weeks ago. EXAM: CT ABDOMEN AND PELVIS WITH CONTRAST TECHNIQUE: Multidetector CT imaging of the abdomen  and pelvis was performed using the standard protocol following bolus administration of intravenous contrast. CONTRAST:  ISOVUE-300 IOPAMIDOL (ISOVUE-300) INJECTION 61% COMPARISON:  None. FINDINGS: Dependent atelectasis in the lung bases. Small esophageal hiatal hernia. Coronary artery calcifications. Pulmonary arteries are not entirely included within the field of view but there is suggestion of a filling defect in the right main pulmonary artery. Pulmonary embolus is not excluded. The liver, spleen, pancreas, adrenal glands, inferior vena cava, and retroperitoneal lymph nodes are unremarkable. Calcification of aorta without aneurysm. Left renal hydronephrosis with ureterectasis. No stones are identified. Possibly due to reflux. Non radiopaque stone or stricture could also account for this  appearance. Stomach, small bowel, and colon are not abnormally distended. Contrast material flows through to the rectum without evidence of bowel obstruction. Diverticulosis of the colon without inflammatory change. No free air or free fluid in the abdomen. Pelvis: The bladder is diffusely distended without wall thickening or filling defect. This may be physiologic or could indicate bowel obstruction or impaired bladder contraction. Uterus and ovaries are not enlarged. Calcifications in the uterus suggesting small fibroids. Appendix is not identified. Stool in the rectum with suggestion of pelvic floor descent. Contrast material in the gluteal crease likely due to incontinence. Degenerative changes in the spine. Old fractures of the left inferior pubic ramus. IMPRESSION: No evidence of bowel obstruction. Suggestion of pelvic floor descent and rectal incontinence. Diffusely fluid distended bladder. Left hydronephrosis and hydroureter. No stone identified. Possible reflux or obstruction due to an occult stone or stricture. Small esophageal hiatal hernia. Possible filling defect in the right main pulmonary artery could indicate pulmonary embolus. These results were called by telephone at the time of interpretation on 01/07/2016 at 3:38 am to Dr. Rochele Raring , who verbally acknowledged these results. Electronically Signed   By: Burman Nieves M.D.   On: 01/07/2016 03:44    Medications:   . cefTRIAXone (ROCEPHIN)  IV  1 g Intravenous Q24H  . OLANZapine  5 mg Oral QHS  . sodium chloride flush  3 mL Intravenous Q12H   Continuous Infusions: . sodium chloride 50 mL/hr at 01/08/16 0114    Time spent: 35 minutes.  The patient is medically complex with multiple co-morbidities and is at high risk for clinical deterioration and requires high complexity decision making and ongoing discussions with the palliative care team.    LOS: 1 day   Dorla Guizar  [facility] Pager [phone]. If unable to reach  me by pager, please call my cell phone at [phone].  *Please refer to [email or web address], password [email or web address] to get updated schedule on who will round on this patient, as hospitalists switch teams weekly. If 7PM-7AM, please contact night-coverage at [email or web address], password [email or web address] for any overnight needs.  01/08/2016, 8:17 AM

## 2016-01-08 NOTE — Progress Notes (Signed)
Initial Nutrition Assessment  DOCUMENTATION CODES:   Severe malnutrition in context of chronic illness  INTERVENTION:  Ensure Enlive TID. Each supplement provides 350 kcals and 20 grams of protein.   NUTRITION DIAGNOSIS:   Malnutrition related to chronic illness as evidenced by severe depletion of muscle mass, severe depletion of body fat.  GOAL:   Patient will meet greater than or equal to 90% of their needs  MONITOR:   PO intake, Supplement acceptance, Labs, Weight trends, Skin, I & O's  REASON FOR ASSESSMENT:   Consult Assessment of nutrition requirement/status  ASSESSMENT:   Pt with past medical history of dementia, hypertension, and concern for recent GI bleed. She was admitted on 01/07/2016 with an inability to walk, delirium, and bilateral PE.  Pt not able to communicate d/t delirium/dementia.   Visitor at bedside but daughter not present; not able to obtain diet or weight hx. Visitor reports pt not eating any breakfast. She states she has been giving pt sips of water and would be amenable to trying to give her Ensure. Will order TID.  NFPE reveals severe muscle and fat depletion, no edema. Pt meets criteria for severe malnutrition.   Labs reviewed; Ca 8.4, GFR 60. Meds reviewed.  Diet Order:  Diet Heart Room service appropriate?: Yes; Fluid consistency:: Thin  Skin:  Wound (see comment) (unstg R heel)  Last BM:  6/15  Height:   Ht Readings from Last 1 Encounters:  12/29/15 5\' 2"  (1.575 m)    Weight:   Wt Readings from Last 1 Encounters:  12/29/15 128 lb 4.8 oz (58.196 kg)    Ideal Body Weight:  50 kg  BMI:  23.5 (using most recent weight from 12/29/15)  Estimated Nutritional Needs:   Kcal:  1150-1350 kcals   Protein:  65-75 g  Fluid:  1.2-1.4 L  EDUCATION NEEDS:   No education needs identified at this time  Beryle QuantMeredith Andrews, MS [employer] Dietetic Intern Pager [phone]   Chart reviewed. I agree with student dietitian  note.  Joaquin CourtsKimberly Jiaire Rosebrook, RD, LDN, CNSC Pager# [phone] After Hours Pager# [phone]

## 2016-01-08 NOTE — Progress Notes (Signed)
Daily Progress Note   Patient Name: Elizabeth Booth       Date: 01/08/2016 DOB: Feb 03, 1923  Age: 82 y.o. MRN#: [id number] Attending Physician: Elizabeth Bun Rama, MD Primary Care Physician: Elizabeth Mountain, MD Admit Date: 01/07/2016  Reason for Consultation/Follow-up: Establishing goals of care  Subjective: Patient can not speak intelligibly.  I'm uncertain if this is due to the haldol she received last night or if this is actually infection/dementia/delirium.    I spoke with Elizabeth Booth on the phone.  She has classic "Caregiver Burnout".  She initially said she didn't want to speak with me - she had other things to do.  But I managed to tell her that her mother was very sick, possibly at end of life and we were going to ask Hospice to evaluate her.  Elizabeth Booth said that was OK.   I asked if she knew what Hospice was and she replied "that's the place where they make um sicker until they die I guess".  Then Elizabeth Booth told me her mother was not going to a nursing home, but she was going to rehab.   Elizabeth Booth seems quite depressed and unable to comprehend what is happening.  She is in severe need of support and guidence from family / friends or who ever can give it.    Unfortunately I do not believe Elizabeth Booth and Elizabeth Booth have any other family.  I am concerned for Elizabeth Booth, but I am also very concerned about what will happen to Elizabeth Booth.   Length of Stay: 1  Current Medications: Scheduled Meds:  . cefTRIAXone (ROCEPHIN)  IV  1 g Intravenous Q24H  . feeding supplement (ENSURE ENLIVE)  237 mL Oral TID BM  . OLANZapine  5 mg Oral QHS  . sodium chloride flush  3 mL Intravenous Q12H    Continuous Infusions: . sodium chloride 50 mL/hr at 01/08/16 0114    PRN Meds: acetaminophen **OR** acetaminophen, haloperidol  lactate, hydrALAZINE, LORazepam, ondansetron **OR** ondansetron (ZOFRAN) IV, oxyCODONE  Physical Exam          Vital Signs: BP 117/32 mmHg  Pulse 82  Temp(Src) 98.2 F (36.8 C) (Oral)  Resp 20  SpO2 94% SpO2: SpO2: 94 % O2 Device: O2 Device: Nasal Cannula O2 Flow Rate: O2 Flow Rate (L/min): 4 L/min  Intake/output summary:  Intake/Output Summary (Last  24 hours) at 01/08/16 1648 Last data filed at 01/08/16 1504  Gross per 24 hour  Intake     71 ml  Output   1065 ml  Net   -994 ml   LBM: Last BM Date: 01/08/16 Baseline Weight:   Most recent weight:         Palliative Assessment/Data:    Flowsheet Rows        Most Recent Value   Intake Tab    Referral Department  Hospitalist   Unit at Time of Referral  ER   Palliative Care Primary Diagnosis  Other (Comment) [GI Bleed]   Date Notified  01/07/16   Palliative Care Type  Return patient Palliative Care   Reason for referral  Clarify Goals of Care   Date of Admission  01/07/16   Date first seen by Palliative Care  01/07/16   # of days Palliative referral response time  0 Day(s)   # of days IP prior to Palliative referral  0   Clinical Assessment    Psychosocial & Spiritual Assessment    Palliative Care Outcomes       Patient Active Problem List   Diagnosis Date Noted  . Pressure ulcer 01/08/2016  . Protein-calorie malnutrition, severe 01/08/2016  . UTI (urinary tract infection) 01/08/2016  . Adenovirus enteritis 01/08/2016  . Bilateral pulmonary embolism (HCC) with probable DVT resulting in knee pain 01/07/2016  . Urinary retention 01/07/2016  . Pelvic floor dysfunction 01/07/2016  . Dementia with behavioral disturbance 01/07/2016  . Diarrhea   . Delirium   . Palliative care encounter   . Goals of care, counseling/discussion   . DNR (do not resuscitate) discussion   . Melena 12/29/2015  . Hypertension 12/29/2015  . GI bleed 12/29/2015  . Oxygen desaturation during sleep     Palliative Care Assessment &  Plan   Patient Profile: 82 y.o. female with past medical history of dementia, hypertension, and concern for recent GI bleed. She was admitted on 01/07/2016 with an inability to walk, delirium, and bilateral PE. Of note [facility] and was observed for GI bleed, hyponatremia, and an inability to walk due to knee pain.   Assessment: 82 yo female with progressive dementia who now presents with bilateral PEs.  She has been unable to walk since at least her last admission 6/5.  She is not eating well and has an albumin of 2.1.  She is currently suffering with delirium, UTI and is positive for adenovirus.  There is a question of GI bleed although her hgb is stable.  Support resources at home are minimal and her daughter has severe care-giver burnout and likely depression.   Patient is not showing improvement in the past 24 hours since admission.  If there is no improvement in the next 24 hours I would anticipate that this patient is near end of life and likely appropriate for residential hospice.    Recommendations/Plan:  Continue to treat the treatable and observe for another 24 hours.  If patient shows no improvement she will very likely be appropriate for residential hospice.  Hospice evaluation has been requested.  I have discussed this with Elizabeth Booth.  Daughter, Elizabeth NormaJeanie needs support, guidance, and likely bereavement counseling from hospice.  Daughter is absolutely unable to care for the patient at home.  Olanzepine QHS has been added for delirium, sleep, and appetite.  Goals of Care and Additional Recommendations:  Limitations on Scope of Treatment:  Minimize Medications  Code Status: DNR Prognosis:   < 2 weeks given delirium, progressive dementia, and very low PO intake.  Discharge Planning:  Hospice facility is recommended.  Care plan was discussed with Elizabeth Booth, and [facility] attending.  Thank you for allowing the Palliative Medicine  Team to assist in the care of this patient.   Time In: 8:30 Time Out: 9:05 Total Time 35 Prolonged Time Billed NO      Greater than 50%  of this time was spent counseling and coordinating care related to the above assessment and plan.  Elizabeth Downs, PA-C Palliative Medicine Pager: [phone]   Please contact Palliative Medicine Team phone at [phone] for questions and concerns.

## 2016-01-09 ENCOUNTER — Encounter (HOSPITAL_COMMUNITY): Payer: Self-pay | Admitting: General Practice

## 2016-01-09 DIAGNOSIS — L89610 Pressure ulcer of right heel, unstageable: Secondary | ICD-10-CM

## 2016-01-09 DIAGNOSIS — A082 Adenoviral enteritis: Secondary | ICD-10-CM | POA: Diagnosis not present

## 2016-01-09 DIAGNOSIS — Z7189 Other specified counseling: Secondary | ICD-10-CM

## 2016-01-09 DIAGNOSIS — R197 Diarrhea, unspecified: Secondary | ICD-10-CM

## 2016-01-09 DIAGNOSIS — Z515 Encounter for palliative care: Secondary | ICD-10-CM | POA: Diagnosis not present

## 2016-01-09 DIAGNOSIS — R339 Retention of urine, unspecified: Secondary | ICD-10-CM | POA: Diagnosis not present

## 2016-01-09 DIAGNOSIS — E876 Hypokalemia: Secondary | ICD-10-CM

## 2016-01-09 DIAGNOSIS — I2699 Other pulmonary embolism without acute cor pulmonale: Secondary | ICD-10-CM | POA: Diagnosis not present

## 2016-01-09 DIAGNOSIS — R410 Disorientation, unspecified: Secondary | ICD-10-CM | POA: Diagnosis not present

## 2016-01-09 DIAGNOSIS — L89620 Pressure ulcer of left heel, unstageable: Secondary | ICD-10-CM

## 2016-01-09 DIAGNOSIS — F0391 Unspecified dementia with behavioral disturbance: Secondary | ICD-10-CM | POA: Diagnosis not present

## 2016-01-09 HISTORY — DX: Pressure ulcer of right heel, unstageable: L89.610

## 2016-01-09 LAB — CBC
HCT: 32.3 % — ABNORMAL LOW (ref 36.0–46.0)
Hemoglobin: 9.8 g/dL — ABNORMAL LOW (ref 12.0–15.0)
MCH: 29 pg (ref 26.0–34.0)
MCHC: 30.3 g/dL (ref 30.0–36.0)
MCV: 95.6 fL (ref 78.0–100.0)
PLATELETS: 283 10*3/uL (ref 150–400)
RBC: 3.38 MIL/uL — ABNORMAL LOW (ref 3.87–5.11)
RDW: 13.8 % (ref 11.5–15.5)
WBC: 8 10*3/uL (ref 4.0–10.5)

## 2016-01-09 LAB — BASIC METABOLIC PANEL
Anion gap: 6 (ref 5–15)
BUN: 14 mg/dL (ref 6–20)
CALCIUM: 8.2 mg/dL — AB (ref 8.9–10.3)
CHLORIDE: 108 mmol/L (ref 101–111)
CO2: 25 mmol/L (ref 22–32)
CREATININE: 0.65 mg/dL (ref 0.44–1.00)
GFR calc Af Amer: >60 mL/min (ref >60)
Glucose, Bld: 99 mg/dL (ref 65–99)
Potassium: 3.4 mmol/L — ABNORMAL LOW (ref 3.5–5.1)
SODIUM: 139 mmol/L (ref 135–145)

## 2016-01-09 MED ORDER — HEPARIN (PORCINE) IN NACL 100-0.45 UNIT/ML-% IJ SOLN: 900.0000 [IU]/h | INTRAMUSCULAR | Status: DC

## 2016-01-09 MED ORDER — POTASSIUM CHLORIDE IN NACL 20-0.9 MEQ/L-% IV SOLN
INTRAVENOUS | Status: DC
  Administered 2016-01-09 – 2016-01-11 (×4): via INTRAVENOUS
  Filled 2016-01-09 (×4): qty 1000

## 2016-01-09 MED ORDER — LEVOFLOXACIN IN D5W 250 MG/50ML IV SOLN
250.0000 mg | INTRAVENOUS | Status: DC
  Administered 2016-01-09 – 2016-01-11 (×3): 250 mg via INTRAVENOUS
  Filled 2016-01-09 (×3): qty 50

## 2016-01-09 MED ORDER — APIXABAN 5 MG PO TABS: 5.0000 mg | ORAL_TABLET | Freq: Two times a day (BID) | ORAL | Status: DC

## 2016-01-09 MED ORDER — APIXABAN 5 MG PO TABS
10.0000 mg | ORAL_TABLET | Freq: Two times a day (BID) | ORAL | Status: DC
Start: 2016-01-09 — End: 2016-01-11
  Administered 2016-01-09 – 2016-01-11 (×5): 10 mg via ORAL
  Filled 2016-01-09 (×5): qty 2

## 2016-01-09 NOTE — [place] FL2 (Signed)
[place] MEDICAID FL2 LEVEL OF CARE SCREENING TOOL     IDENTIFICATION  Patient Name: Elizabeth Booth Birthdate: 19-Jul-1923 Sex: female Admission Date (Current Location): 01/07/2016  [facility] and [place] Number:  Producer, television/film/video and Address:  [facility], [address], [place], [place] [address]      Provider Number:    Attending Physician Name and Address:  Maryruth Bun Rama, MD  Relative Name and Phone Number:       Current Level of Care: SNF Recommended Level of Care: Skilled Nursing Facility Prior Approval Number:    Date Approved/Denied:   PASRR Number: [id number]  Discharge Plan: SNF    Current Diagnoses: Patient Active Problem List   Diagnosis Date Noted  . Hypokalemia 01/09/2016  . Pressure ulcer of both heels, unstageable (HCC), present on admission 01/09/2016  . Protein-calorie malnutrition, severe 01/08/2016  . UTI (urinary tract infection) 01/08/2016  . Adenovirus enteritis 01/08/2016  . Acute delirium   . Encounter for hospice care discussion   . Bilateral pulmonary embolism (HCC) with probable DVT resulting in knee pain 01/07/2016  . Urinary retention 01/07/2016  . Pelvic floor dysfunction 01/07/2016  . Dementia with behavioral disturbance 01/07/2016  . Diarrhea   . Delirium   . Palliative care encounter   . Goals of care, counseling/discussion   . DNR (do not resuscitate) discussion   . Melena 12/29/2015  . Hypertension 12/29/2015  . GI bleed 12/29/2015  . Oxygen desaturation during sleep     Orientation RESPIRATION BLADDER Height & Weight     Self, Place  Normal Incontinent Weight:   Height:     BEHAVIORAL SYMPTOMS/MOOD NEUROLOGICAL BOWEL NUTRITION STATUS   (None)  (None) Incontinent  (Heart )  AMBULATORY STATUS COMMUNICATION OF NEEDS Skin   Extensive Assist Verbally PU Stage and Appropriate Care (PU on LT and RT unstageable foam dressing)                       Personal Care Assistance Level of  Assistance  Bathing, Dressing, Feeding Bathing Assistance: Maximum assistance Feeding assistance: Independent Dressing Assistance: Maximum assistance     Functional Limitations Info  Sight, Hearing, Speech Sight Info: Adequate Hearing Info: Adequate Speech Info: Adequate    SPECIAL CARE FACTORS FREQUENCY  PT (By licensed PT), OT (By licensed OT)     PT Frequency: 5/ week OT Frequency: 5/ week            Contractures Contractures Info: Not present    Additional Factors Info  Isolation Precautions Code Status Info: FULL Allergies Info: Penicillins     Isolation Precautions Info: Contact precautions Adenovirus detected in stool sample.     Current Medications (01/09/2016):  This is the current hospital active medication list Current Facility-Administered Medications  Medication Dose Route Frequency Provider Last Rate Last Dose  . 0.9 % NaCl with KCl 20 mEq/ L  infusion   Intravenous Continuous Maryruth Bun Rama, MD 75 mL/hr at 01/09/16 0835    . acetaminophen (TYLENOL) tablet 650 mg  650 mg Oral Q6H PRN Ozella Rocks, MD   650 mg at 01/09/16 1539   Or  . acetaminophen (TYLENOL) suppository 650 mg  650 mg Rectal Q6H PRN Ozella Rocks, MD      . apixaban Everlene Balls) tablet 10 mg  10 mg Oral BID Maryruth Bun Rama, MD   10 mg at 01/09/16 1315   Followed by  . [START ON 01/16/2016] apixaban (  ELIQUIS) tablet 5 mg  5 mg Oral BID Christina P Rama, MD      . feeding supplement (ENSURE ENLIVE) (ENSURE ENLIVE) liquid 237 mL  237 mL Oral TID BM Idell PicklesKimberly A Harris, RD   237 mL at 01/09/16 1044  . haloperidol lactate (HALDOL) injection 0.5 mg  0.5 mg Intramuscular Q6H PRN Christina P Rama, MD      . hydrALAZINE (APRESOLINE) injection 5-10 mg  5-10 mg Intravenous Q4H PRN Ozella Rocksavid J Merrell, MD      . Levofloxacin [facility](LEVAQUIN) IVPB 250 mg  250 mg Intravenous Q24H Rachel L Rumbarger, RPH   250 mg at 01/09/16 1044  . LORazepam (ATIVAN) injection 1 mg  1 mg Intravenous Q4H PRN Ozella Rocksavid J Merrell, MD    1 mg at 01/07/16 1611  . OLANZapine (ZYPREXA) tablet 5 mg  5 mg Oral QHS Stephani PoliceMarianne L York, PA-C   5 mg at 01/08/16 2232  . ondansetron (ZOFRAN) tablet 4 mg  4 mg Oral Q6H PRN Ozella Rocksavid J Merrell, MD       Or  . ondansetron [facility](ZOFRAN) injection 4 mg  4 mg Intravenous Q6H PRN Ozella Rocksavid J Merrell, MD      . oxyCODONE (Oxy IR/ROXICODONE) immediate release tablet 5 mg  5 mg Oral Q4H PRN Ozella Rocksavid J Merrell, MD      . sodium chloride flush (NS) 0.9 % injection 3 mL  3 mL Intravenous Q12H Ozella Rocksavid J Merrell, MD   3 mL at 01/08/16 2232     Discharge Medications: Please see discharge summary for a list of discharge medications.  Relevant Imaging Results:  Relevant Lab Results:   Additional Information SS#: [id number]  Reggy EyeLaShonda A Ishani Goldwasser, LCSW

## 2016-01-09 NOTE — Progress Notes (Signed)
Progress Note    Elizabeth Booth:[id number] DOB: June 24, 1923  DOA: 01/07/2016 PCP: Irven Shelling, MD    Brief Narrative:   Elizabeth Booth is an 82 y.o. female with a PMH of hypertension and severe dementia, recent hospitalization 12/29/15-01/01/16 for evaluation of melena (family refused EGD during that visit, hemoglobin stabilized without further melanotic stools and therefore she was discharged), who was readmitted 01/07/16 with a chief complaint of dark stools and knee pain. Of note, she was FOBT negative (had been taking Pepto Bismol), and CT scan of abdomen and pelvis and chest fairly unremarkable other than apparent pelvic floor dysfunction and CTA chest showing pulmonary emboli.  Assessment/Plan:   Principal Problem:   Bilateral pulmonary embolism ([place]) with probable DVT resulting in knee pain Suspect these come from patient's recent bedbound state. Patient with new found extensive lower extremity edema predominantly on the left suggesting left lower extremity DVT as a primary source for PE. Initially placed on heparin drip, but this was changed to Eliquis.  Eliquis subsequently discontinued due to ongoing black stools. Hemoglobin dropped 1 g overnight, repeat fecal occult blood testing was negative. Resume Eliquis.  Active Problems:   Melena/black diarrhea Repeat fecal occult blood testing negative. Continue to monitor hemoglobin/hematocrit. 1 g drop in hemoglobin noted over the past 24 hours. C. difficile negative. GI pathogen panel positive for enteric adenovirus. Continue supportive care and contact precautions.    Hypokalemia Add potassium to IV fluids.    Severe malnutrition Evaluated by dietitian. Supplements ordered.    Hypertension Hydralazine IV ordered as needed.    Dementia with behavioral disturbance/delirium Advanced. On Zyprexa and Ativan. Also on Haldol as needed for agitation. Reduce dose.    DNR (do not resuscitate) discussion The patient's  daughter apparently has been inconsistent and changes her mind frequently with regard to the patient's CODE STATUS. May need an ethics consultation and social worker consult given her complex social situation. Met with the patient's daughter and the palliative care team today to provide education regarding CODE STATUS and the patient daughter currently agrees that her mother would not want to be resuscitated in the event of a cardiopulmonary arrest. Patient's daughter is also agreeable for rehabilitation placement.    Urinary retention/Pelvic floor dysfunction Catheter in place.    Knee pain Likely from DVT.    Bilateral heel pressure ulcers, present on admission The patient has unstageable pressure injuries to her bilateral Achilles area. Wound care per wound care nurse recommendations. Prevalon boots for offloading at all times.    UTI, cultures positive for enterococcus and staph aureus Initially placed on Rocephin. Antibiotics switched to Levaquin 01/09/16.   Family Communication/Anticipated D/C date and plan/Code Status   DVT prophylaxis: SCDs ordered. Code Status: DNR  Family Communication: Daughter updated at the bedside. Disposition Plan: SNF placement when bed available.   Medical Consultants:    Palliative Care   Procedures:    Anti-Infectives:   Rocephin 01/07/16--->01/09/16 Levaquin 01/09/16--->   Subjective:   Elizabeth Booth is Much more awake and alert today, though she remains disoriented and confused. Calling out to her daughter. Denies chest pain or shortness of breath. Wants to go home.  Objective:    Filed Vitals:   01/08/16 0439 01/08/16 1350 01/08/16 2149 01/09/16 0506  BP: 122/78 117/32 124/55 133/44  Pulse:  82 79 76  Temp: 97.8 F (36.6 C) 98.2 F (36.8 C) 97.8 F (36.6 C) 97.7 F (36.5 C)  TempSrc: Oral Oral Oral  Oral  Resp: _0 SpO2: 91% 94% 100% 98%    Intake/Output Summary (Last 24 hours) at 01/09/16 0830 Last data filed at  01/09/16 0713  Gross per 24 hour  Intake    361 ml  Output   1315 ml  Net   -954 ml   There were no vitals filed for this visit.  Exam: General exam: Awake, restless.  Respiratory system: Clear to auscultation. Respiratory effort increased with deep inspirations. Cardiovascular system: S1 & S2 heard, RRR. No JVD,  rubs, gallops or clicks. No murmurs. Gastrointestinal system: Abdomen is nondistended, soft and nontender. No organomegaly or masses felt. Normal bowel sounds heard.  Central nervous system: Alert but confused and disoriented. No focal neurological deficits. Extremities: No clubbing, edema, or cyanosis. Skin: Large eccymosis posterior right thigh. Psychiatry: Mildly anxious.   Data Reviewed:   I have personally reviewed following labs and imaging studies:  Labs: Basic Metabolic Panel:  Recent Labs Lab 01/07/16 0135 01/08/16 0348 01/09/16 0437  NA 138 141 139  K 3.9 3.9 3.4*  CL 104 107 108  CO2 _1 GLUCOSE 100* 83 99  BUN 27* 18 14  CREATININE 1.00 0.83 0.65  CALCIUM 8.6* 8.4* 8.2*   GFR Estimated Creatinine Clearance: 36.2 mL/min (by C-G formula based on Cr of 0.65). Liver Function Tests:  Recent Labs Lab 01/07/16 0135  AST 29  ALT 25  ALKPHOS 86  BILITOT 0.5  PROT 5.8*  ALBUMIN 2.1*   Coagulation profile  Recent Labs Lab 01/07/16 0135  INR 1.49    CBC:  Recent Labs Lab 01/07/16 0135 01/08/16 0348 01/09/16 0437  WBC 12.8* 9.4 8.0  NEUTROABS 10.2*  --   --   HGB 10.9* 10.9* 9.8*  HCT 35.2* 36.4 32.3*  MCV 92.6 96.0 95.6  PLT 238 262 283   Cardiac Enzymes:  Recent Labs Lab 01/07/16 0506  TROPONINI 0.03   Urine analysis:    Component Value Date/Time   COLORURINE YELLOW 01/07/2016 0501   APPEARANCEUR TURBID* 01/07/2016 0501   LABSPEC 1.041* 01/07/2016 0501   PHURINE 5.5 01/07/2016 0501   GLUCOSEU NEGATIVE 01/07/2016 0501   HGBUR LARGE* 01/07/2016 0501   BILIRUBINUR NEGATIVE 01/07/2016 0501   KETONESUR 15*  01/07/2016 0501   PROTEINUR NEGATIVE 01/07/2016 0501   NITRITE NEGATIVE 01/07/2016 0501   LEUKOCYTESUR LARGE* 01/07/2016 0501   Microbiology Recent Results (from the past 240 hour(s))  C difficile quick scan w PCR reflex     Status: None   Collection Time: 01/07/16  5:01 AM  Result Value Ref Range Status   C Diff antigen NEGATIVE NEGATIVE Final   C Diff toxin NEGATIVE NEGATIVE Final   C Diff interpretation Negative for toxigenic C. difficile  Final  Gastrointestinal Panel by PCR , Stool     Status: Abnormal   Collection Time: 01/07/16  5:01 AM  Result Value Ref Range Status   Campylobacter species NOT DETECTED NOT DETECTED Final   Plesimonas shigelloides NOT DETECTED NOT DETECTED Final   Salmonella species NOT DETECTED NOT DETECTED Final   Yersinia enterocolitica NOT DETECTED NOT DETECTED Final   Vibrio species NOT DETECTED NOT DETECTED Final   Vibrio cholerae NOT DETECTED NOT DETECTED Final   Enteroaggregative E coli (EAEC) NOT DETECTED NOT DETECTED Final   Enteropathogenic E coli (EPEC) NOT DETECTED NOT DETECTED Final   Enterotoxigenic E coli (ETEC) NOT DETECTED NOT DETECTED Final   Shiga like toxin producing E coli (STEC) NOT DETECTED NOT  DETECTED Final   E. coli O157 NOT DETECTED NOT DETECTED Final   Shigella/Enteroinvasive E coli (EIEC) NOT DETECTED NOT DETECTED Final   Cryptosporidium NOT DETECTED NOT DETECTED Final   Cyclospora cayetanensis NOT DETECTED NOT DETECTED Final   Entamoeba histolytica NOT DETECTED NOT DETECTED Final   Giardia lamblia NOT DETECTED NOT DETECTED Final   Adenovirus F40/41 DETECTED (A) NOT DETECTED Final   Astrovirus NOT DETECTED NOT DETECTED Final   Norovirus GI/GII NOT DETECTED NOT DETECTED Final   Rotavirus A NOT DETECTED NOT DETECTED Final   Sapovirus (I, II, IV, and V) NOT DETECTED NOT DETECTED Final  Urine culture     Status: Abnormal (Preliminary result)   Collection Time: 01/07/16  5:01 AM  Result Value Ref Range Status   Specimen  Description URINE, CATHETERIZED  Final   Special Requests ADDED [id number]  Final   Culture (A)  Final    >=100,000 COLONIES/mL ENTEROCOCCUS SPECIES 60,000 COLONIES/mL STAPHYLOCOCCUS AUREUS SUSCEPTIBILITIES TO FOLLOW    Report Status PENDING  Incomplete   Organism ID, Bacteria ENTEROCOCCUS SPECIES (A)  Final      Susceptibility   Enterococcus species - MIC*    AMPICILLIN <=2 SENSITIVE Sensitive     LEVOFLOXACIN 1 SENSITIVE Sensitive     NITROFURANTOIN <=16 SENSITIVE Sensitive     VANCOMYCIN 1 SENSITIVE Sensitive     * >=100,000 COLONIES/mL ENTEROCOCCUS SPECIES    Radiology: Dg Knee 1-2 Views Left  01/07/2016  CLINICAL DATA:  Pain EXAM: LEFT KNEE - 1-2 VIEW COMPARISON:  None. FINDINGS: Frontal and lateral views were obtained. There is no fracture or dislocation. There is a joint effusion. There is soft rather marked joint space narrowing medially. Other joint spaces appear normal. There are multiple foci of chondrocalcinosis. There is a small spur along the anterior superior patella. IMPRESSION: Osteoarthritic change with rather marked narrowing medially. Small joint effusion. Chondrocalcinosis is present. Chondrocalcinosis may be seen with osteoarthritis but also may be indicative of calcium pyrophosphate deposition disease. Electronically Signed   By: Lowella Grip III M.D.   On: 01/07/2016 09:33   Dg Knee 1-2 Views Right  01/07/2016  CLINICAL DATA:  Knee pain bilaterally.  Dementia. EXAM: RIGHT KNEE - 1-2 VIEW COMPARISON:  12/29/2015 FINDINGS: Knee joint effusion is moderate and has decreased from prior. No fracture deformity or subluxation. Advanced knee osteoarthritis with near bone-on-bone contact at the medial compartment. Spurring is diffuse and bulky. Subchondral cyst at the tibial eminence. Nonspecific subcutaneous reticulation of the leg that is increased. Chondrocalcinosis and osteopenia. IMPRESSION: 1. No acute osseous finding. 2. Knee joint effusion that is decreased from  12/29/2015. 3. Increased but nonspecific leg edema. 4. Advanced tricompartmental osteoarthritis. Electronically Signed   By: Monte Fantasia M.D.   On: 01/07/2016 09:32    Medications:   . cefTRIAXone (ROCEPHIN)  IV  1 g Intravenous Q24H  . feeding supplement (ENSURE ENLIVE)  237 mL Oral TID BM  . OLANZapine  5 mg Oral QHS  . sodium chloride flush  3 mL Intravenous Q12H   Continuous Infusions: . 0.9 % NaCl with KCl 20 mEq / L      Time spent: 35 minutes.  The patient is medically complex with multiple co-morbidities and is at high risk for clinical deterioration and requires high complexity decision making and ongoing discussions with the palliative care team, with family meeting held in coordination with the palliative care team involving the patient's daughter and nephew.    LOS: 2 days   Deklin Bieler  [facility] Pager [phone]. If unable to reach me by pager, please call my cell phone at [phone].  *Please refer to [email or web address], password [email or web address] to get updated schedule on who will round on this patient, as hospitalists switch teams weekly. If 7PM-7AM, please contact night-coverage at [email or web address], password [email or web address] for any overnight needs.  01/09/2016, 8:30 AM

## 2016-01-09 NOTE — Progress Notes (Addendum)
Daily Progress Note   Patient Name: Elizabeth Booth       Date: 01/09/2016 DOB: 11-04-1922  Age: 82 y.o. MRN#: [id number] Attending Physician: Elizabeth Maxon Rama, MD Primary Care Physician: Elizabeth Shelling, MD Admit Date: 01/07/2016  Reason for Consultation/Follow-up: Establishing goals of care  Subjective: From the patient:  "I'm feeling better, am I going home today?"   Elizabeth Booth had a better night last night.  She received no Haldol or Ativan over night.  This morning she wakes from sleep easily and has an appropriate conversation with me.  She tells me that she and Elizabeth Booth don't get along.  She says she will not go to rehab for a few weeks - she wants to go home to her own house.   She seems to have very poor insight into her current condition.  Addendum: I met with daughter Elizabeth Booth, nephew Elizabeth Booth, and Dr. Rockne Booth in the patient's room.   Dr. Rockne Booth clearly explained the patient's current health issues (PEs, Confusion, Dementia, UTI) and plan.  The plan is to d/c to acute rehab with Palliative outpatient and then reassess in the next 10 - 15 days to see if the patient has improved or declined and determine the best long term disposition.   Dr. Rockne Booth re-explained and re-confirmed code status of DNR for the patient with Elizabeth Booth.  I anticipate the patient will need Hospice after rehab.   Length of Stay: 2  Current Medications: Scheduled Meds:  . apixaban  10 mg Oral BID   Followed by  . [START ON 01/16/2016] apixaban  5 mg Oral BID  . feeding supplement (ENSURE ENLIVE)  237 mL Oral TID BM  . levofloxacin (LEVAQUIN) IV  250 mg Intravenous Q24H  . OLANZapine  5 mg Oral QHS  . sodium chloride flush  3 mL Intravenous Q12H    Continuous Infusions: . 0.9 % NaCl with KCl 20 mEq / L 75  mL/hr at 01/09/16 0835    PRN Meds: acetaminophen **OR** acetaminophen, haloperidol lactate, hydrALAZINE, LORazepam, ondansetron **OR** ondansetron (ZOFRAN) IV, oxyCODONE  Physical Exam          Vital Signs: BP 133/44 mmHg  Pulse 76  Temp(Src) 97.7 F (36.5 C) (Oral)  Resp 17  SpO2 99% SpO2: SpO2: 99 % O2 Device: O2 Device: Not Delivered  O2 Flow Rate: O2 Flow Rate (L/min): 4 L/min  Intake/output summary:   Intake/Output Summary (Last 24 hours) at 01/09/16 1105 Last data filed at 01/09/16 7939  Gross per 24 hour  Intake    527 ml  Output    925 ml  Net   -398 ml   LBM: Last BM Date: 01/08/16 Baseline Weight:   Most recent weight:         Palliative Assessment/Data:    Flowsheet Rows        Most Recent Value   Intake Tab    Referral Department  Hospitalist   Unit at Time of Referral  ER   Palliative Care Primary Diagnosis  Other (Comment) [GI Bleed]   Date Notified  01/07/16   Palliative Care Type  Return patient Palliative Care   Reason for referral  Clarify Goals of Care   Date of Admission  01/07/16   Date first seen by Palliative Care  01/07/16   # of days Palliative referral response time  0 Day(s)   # of days IP prior to Palliative referral  0   Clinical Assessment    Psychosocial & Spiritual Assessment    Palliative Care Outcomes       Patient Active Problem List   Diagnosis Date Noted  . Hypokalemia 01/09/2016  . Pressure ulcer 01/08/2016  . Protein-calorie malnutrition, severe 01/08/2016  . UTI (urinary tract infection) 01/08/2016  . Adenovirus enteritis 01/08/2016  . Acute delirium   . Encounter for hospice care discussion   . Bilateral pulmonary embolism (HCC) with probable DVT resulting in knee pain 01/07/2016  . Urinary retention 01/07/2016  . Pelvic floor dysfunction 01/07/2016  . Dementia with behavioral disturbance 01/07/2016  . Diarrhea   . Delirium   . Palliative care encounter   . Goals of care, counseling/discussion   . DNR (do  not resuscitate) discussion   . Melena 12/29/2015  . Hypertension 12/29/2015  . GI bleed 12/29/2015  . Oxygen desaturation during sleep     Palliative Care Assessment & Plan   Patient Profile: 82 y.o. female with past medical history of dementia, hypertension, and concern for recent GI bleed. She was admitted on 01/07/2016 with an inability to walk, delirium, and bilateral PE. Of note Elizabeth Booth was just admitted to  [facility] on 6/5 and was observed for GI bleed, hyponatremia, and an inability to walk due to knee pain.   Assessment: 82 yo female with progressive dementia who now presents with bilateral PEs.  She has been unable to walk since at least her last admission 6/5.  She is not eating well and has an albumin of 2.1.  Her delirium appears resolved.  She is positive for UTI and adenovirus.  There is a question of GI bleed.  Her hgb has decreased from 13.3 on 6/5 to 9.8 on 6/16.  This is partly dilutional, but I am concerned for a low level GI bleed.  Support resources at home are minimal and her daughter has severe care-giver burnout and likely depression.   Disposition for this patient is complex.  At this point she is not quite appropriate for [facility].  Her daughter has proven she is no longer able to care for her at home.  The patient and her daughter are both resistant to SNF.  Recommendations/Plan:  My best recommendation is SNF for acute rehab with Palliative Medicine and eventually Hospice.    Daughter, Elizabeth Booth needs support, guidance, and likely counseling from palliative  medicine and hospice.  I will attempt to meet with Elizabeth Booth to complete a MOST form prior to D/C.  Otherwise this can be done by palliative medicine at SNF.  Olanzepine QHS has been added for delirium, sleep, and appetite.  This appears to be well tolerated,  I recommend continuing it at discharge.  Patient will need to be monitored closely for GI bleed as she is on anticoagulation for  bilateral PE.  Patient will need wound care at [facility].  Additional Recommendations:  Limitations on Scope of Treatment: Avoid Hospitalization and Minimize Medications  Code Status: DNR Prognosis:   < 6 months   Progressive dementia, low PO intake, inability to ambulate, un-stageable heal wounds, recurrent dehydration now with bilateral PE.  Discharge Planning:  [facility] for rehab with Palliative care service follow-up is recommended.  Once rehab is complete, I believe the patient will benefit from [place] at SNF or at home.  Care plan was discussed with RN, Case Manager, and [facility] attending.  Thank you for allowing the Palliative Medicine Team to assist in the care of this patient.   Time In: 10:45 Time Out: 11:25 Total Time 35 Prolonged Time Billed NO      Greater than 50%  of this time was spent counseling and coordinating care related to the above assessment and plan.  Imogene Burn, PA-C Palliative Medicine Pager: [phone]   Please contact Palliative Medicine Team phone at [phone] for questions and concerns.

## 2016-01-09 NOTE — Progress Notes (Signed)
Patient was very agitated after shift change so RN had to give her ativan 1mg   as ordered. Therefore, during handoff at 11pm, RN had mentioned that patient slept for 24hrs the first time she was given an ativan. This was not reported to outgoing RN so did not know about it. RN who picked patient at 11pm was encouraged to report that to incoming staff in the morning so that Haldol instead of ativan will be given..Marland Kitchen

## 2016-01-09 NOTE — Evaluation (Signed)
Physical Therapy Evaluation Patient Details Name: Elizabeth HabermannHelen S Booth MRN: [id number] DOB: 09/07/1922 Today's Date: 01/09/2016   History of Present Illness  82 y.o. female with medical history significant of hypertension and severe dementia. Of note patient was discharged from hospital on 12/29/2015 after admission for possible GI bleed, hypotension and mechanical fall; CT chest on 01/07/16 indicated bilateral pulmonary emboli.  Clinical Impression  Pt admitted with above diagnosis. Pt currently with functional limitations due to the deficits listed below (see PT Problem List). Pt was unable to sit EOB without max to total assist due to posterior lean.  Attempts to stand futile.  Will follow acutely.  REcommend SNF. Pt will benefit from skilled PT to increase their independence and safety with mobility to allow discharge to the venue listed below.      Follow Up Recommendations SNF    Equipment Recommendations  Other (comment) (TBA)    Recommendations for Other Services       Precautions / Restrictions Precautions Precautions: Fall Restrictions Weight Bearing Restrictions: No      Mobility  Bed Mobility Overal bed mobility: Needs Assistance Bed Mobility: Supine to Sit;Sit to Supine     Supine to sit: Max assist;+2 for physical assistance Sit to supine: Total assist;+2 for physical assistance   General bed mobility comments: Pt requires assist for all aspects   Transfers Overall transfer level: Needs assistance Equipment used: Rolling walker (2 wheeled);1 person hand held assist Transfers: Sit to/from Stand Sit to Stand: Total assist;+2 physical assistance         General transfer comment: Attempted to stand x 3.  Pt required total A +2 to achieve partial stand each time.  She is unable to extend hips and knees fully.  BM on pad therefore laid pt down and cleaned pt with total assist.   Ambulation/Gait                Stairs            Wheelchair Mobility     Modified Rankin (Stroke Patients Only)       Balance Overall balance assessment: Needs assistance Sitting-balance support: No upper extremity supported;Feet supported Sitting balance-Leahy Scale: Poor Sitting balance - Comments: Posterior LOB- requires Mod A for sitting balance. Pt lethargic. Postural control: Posterior lean Standing balance support: Bilateral upper extremity supported;During functional activity Standing balance-Leahy Scale: Zero Standing balance comment: unable to attain full standing with pt weight bearing on heels with feet sliding out in front of her.  +2 total assist with poor postural stability.                              Pertinent Vitals/Pain Pain Assessment: Faces Faces Pain Scale: Hurts even more Pain Location: bil LES Pain Descriptors / Indicators: Grimacing;Guarding;Moaning Pain Intervention(s): Limited activity within patient's tolerance;Monitored during session;Repositioned  VSS with sats 94% on RA.    Home Living Family/patient expects to be discharged to:: Skilled nursing facility Living Arrangements: Children Available Help at Discharge: Family;Available 24 hours/day Type of Home: House Home Access: Level entry     Home Layout: One level Home Equipment: Walker - 2 wheels      Prior Function Level of Independence: Independent         Comments: daughter reports pt was fully independent PTA      Hand Dominance   Dominant Hand: Right    Extremity/Trunk Assessment   Upper Extremity Assessment: Defer to OT evaluation  Lower Extremity Assessment: RLE deficits/detail;LLE deficits/detail      Cervical / Trunk Assessment: Kyphotic  Communication   Communication: HOH  Cognition Arousal/Alertness: Awake/alert Behavior During Therapy: WFL for tasks assessed/performed Overall Cognitive Status: History of cognitive impairments - at baseline                      General Comments General comments  (skin integrity, edema, etc.): daughter present    Exercises        Assessment/Plan    PT Assessment Patient needs continued PT services  PT Diagnosis Generalized weakness;Acute pain   PT Problem List Decreased strength;Decreased mobility;Decreased activity tolerance;Decreased balance;Decreased cognition;Cardiopulmonary status limiting activity  PT Treatment Interventions Therapeutic exercise;Gait training;Cognitive remediation;Therapeutic activities;Functional mobility training;Patient/family education;Balance training   PT Goals (Current goals can be found in the Care Plan section) Acute Rehab PT Goals Patient Stated Goal: none stated PT Goal Formulation: Patient unable to participate in goal setting Time For Goal Achievement: 01/23/16 Potential to Achieve Goals: Fair    Frequency Min 2X/week   Barriers to discharge Decreased caregiver support      Co-evaluation               End of Session Equipment Utilized During Treatment: Gait belt Activity Tolerance: Patient limited by pain Patient left: in bed;with call bell/phone within reach;with bed alarm set;with family/visitor present Nurse Communication: Mobility status;Need for lift equipment         Time: 1610-9604 PT Time Calculation (min) (ACUTE ONLY): 24 min   Charges:   PT Evaluation $PT Eval Moderate Complexity: 1 Procedure     PT G CodesBerline Lopes 2016-01-23, 3:51 PM [facility] [phone] (pager)

## 2016-01-09 NOTE — Clinical Social Work Note (Addendum)
Clinical Social Work Assessment  Patient Details  Name: Elizabeth Booth MRN: [id number] Date of Birth: Oct 29, 1922  Date of referral:  01/09/16               Reason for consult:  Discharge Planning                Permission sought to share information with:  Facility Sport and exercise psychologist, Family Supports Permission granted to share information::  Yes, Release of Information Signed  Name::     Wellsite geologist::  SNFs  Relationship::  Daughter  Contact Information:     Housing/Transportation Living arrangements for the past 2 months:  Apartment Source of Information:  Patient Patient Interpreter Needed:  None Criminal Activity/Legal Involvement Pertinent to Current Situation/Hospitalization:  No - Comment as needed Significant Relationships:  Adult Children Lives with:  Adult Children Do you feel safe going back to the place where you live?  Yes Need for family participation in patient care:  No (Coment)  Care giving concerns: The patient is aggregable for short term rehab at discharge. Patient would like to rebuild her strength to return home.   Social Worker assessment / plan: CSW met with patient at beside to complete assessment. Patient was resting comfortably in bed. CSW explained PT recommendation for SNF placement. CSW explained SNF search and placement process to the patient and answered her questions. Patient reported her support as her daughter. Patient reported she trust her daughter and she believes she is a good caregiver.  Per patient request CSW called patient's daughter, message left.  CSW will follow up with bed offers.   Employment status:  Retired Forensic scientist:    PT Recommendations:  [place] / Referral to community resources:  [place]  Patient/Family's Response to care:  The patient appears happy with the care she is receiving in hospital and is appreciative of CSW assistance.  Patient/Family's  Understanding of and Emotional Response to Diagnosis, Current Treatment, and Prognosis:  The patient has a little understanding of why she was admitted. She has little understanding of care plan and what she will need post discharge.  Emotional Assessment Appearance:  Appears stated age Attitude/Demeanor/Rapport:   (Patient was welcoming of CSW and appropriate.) Affect (typically observed):  Accepting, Calm, Appropriate Orientation:  Oriented to Self, Oriented to Place, Oriented to  Time Alcohol / Substance use:  Not Applicable Psych involvement (Current and /or in the community):  No (Comment)  Discharge Needs  Concerns to be addressed:  Discharge Planning Concerns Readmission within the last 30 days:  No Current discharge risk:  Lack of support system Barriers to Discharge:  Continued Medical Work up   [employer], [place] 01/09/2016, 4:21 PM

## 2016-01-09 NOTE — NC FL2 (Deleted)
[place] MEDICAID FL2 LEVEL OF CARE SCREENING TOOL     IDENTIFICATION  Patient Name: Elizabeth Booth Birthdate: 10/16/1922 Sex: female Admission Date (Current Location): 01/07/2016  [facility] [place]   Facility and Address:  [facility], [address], [place], [place] [address]      Provider Number:    Attending Physician Name and Address:  Maryruth Bunhristina P Rama, MD  Relative Name and Phone Number:       Current Level of Care: SNF Recommended Level of Care: Skilled Nursing Facility Prior Approval Number:    Date Approved/Denied:   PASRR Number: [id number]  Discharge Plan: SNF    Current Diagnoses: Patient Active Problem List   Diagnosis Date Noted  . Hypokalemia 01/09/2016  . Pressure ulcer of both heels, unstageable (HCC), present on admission 01/09/2016  . Protein-calorie malnutrition, severe 01/08/2016  . UTI (urinary tract infection) 01/08/2016  . Adenovirus enteritis 01/08/2016  . Acute delirium   . Encounter for hospice care discussion   . Bilateral pulmonary embolism (HCC) with probable DVT resulting in knee pain 01/07/2016  . Urinary retention 01/07/2016  . Pelvic floor dysfunction 01/07/2016  . Dementia with behavioral disturbance 01/07/2016  . Diarrhea   . Delirium   . Palliative care encounter   . Goals of care, counseling/discussion   . DNR (do not resuscitate) discussion   . Melena 12/29/2015  . Hypertension 12/29/2015  . GI bleed 12/29/2015  . Oxygen desaturation during sleep     Orientation RESPIRATION BLADDER Height & Weight     Self, Place  Normal Incontinent Weight:   Height:     BEHAVIORAL SYMPTOMS/MOOD NEUROLOGICAL BOWEL NUTRITION STATUS   (None)  (None) Incontinent  (Heart )  AMBULATORY STATUS COMMUNICATION OF NEEDS Skin   Extensive Assist Verbally Normal                       Personal Care Assistance Level of Assistance  Bathing, Dressing, Feeding Bathing Assistance: Maximum  assistance Feeding assistance: Independent Dressing Assistance: Maximum assistance     Functional Limitations Info  Sight, Hearing, Speech Sight Info: Adequate Hearing Info: Adequate Speech Info: Adequate    SPECIAL CARE FACTORS FREQUENCY  PT (By licensed PT), OT (By licensed OT)     PT Frequency: 5/ week OT Frequency: 5/ week            Contractures      Additional Factors Info  Code Status, Allergies Code Status Info: FULL Allergies Info: Penicillins           Current Medications (01/09/2016):  This is the current hospital active medication list Current Facility-Administered Medications  Medication Dose Route Frequency Provider Last Rate Last Dose  . 0.9 % NaCl with KCl 20 mEq/ L  infusion   Intravenous Continuous Maryruth Bunhristina P Rama, MD 75 mL/hr at 01/09/16 0835    . acetaminophen (TYLENOL) tablet 650 mg  650 mg Oral Q6H PRN Ozella Rocksavid J Merrell, MD   650 mg at 01/09/16 1539   Or  . acetaminophen (TYLENOL) suppository 650 mg  650 mg Rectal Q6H PRN Ozella Rocksavid J Merrell, MD      . apixaban Everlene Balls(ELIQUIS) tablet 10 mg  10 mg Oral BID Maryruth Bunhristina P Rama, MD   10 mg at 01/09/16 1315   Followed by  . [START ON 01/16/2016] apixaban (ELIQUIS) tablet 5 mg  5 mg Oral BID Maryruth Bunhristina P Rama, MD      . feeding supplement (  ENSURE ENLIVE) (ENSURE ENLIVE) liquid 237 mL  237 mL Oral TID BM Idell Pickles, RD   237 mL at 01/09/16 1044  . haloperidol lactate (HALDOL) injection 0.5 mg  0.5 mg Intramuscular Q6H PRN Christina P Rama, MD      . hydrALAZINE (APRESOLINE) injection 5-10 mg  5-10 mg Intravenous Q4H PRN Ozella Rocks, MD      . Levofloxacin [facility]) IVPB 250 mg  250 mg Intravenous Q24H Rachel L Rumbarger, RPH   250 mg at 01/09/16 1044  . LORazepam (ATIVAN) injection 1 mg  1 mg Intravenous Q4H PRN Ozella Rocks, MD   1 mg at 01/07/16 1611  . OLANZapine (ZYPREXA) tablet 5 mg  5 mg Oral QHS Stephani Police, PA-C   5 mg at 01/08/16 2232  . ondansetron (ZOFRAN) tablet 4 mg  4 mg Oral Q6H PRN  Ozella Rocks, MD       Or  . ondansetron [facility]) injection 4 mg  4 mg Intravenous Q6H PRN Ozella Rocks, MD      . oxyCODONE (Oxy IR/ROXICODONE) immediate release tablet 5 mg  5 mg Oral Q4H PRN Ozella Rocks, MD      . sodium chloride flush (NS) 0.9 % injection 3 mL  3 mL Intravenous Q12H Ozella Rocks, MD   3 mL at 01/08/16 2232     Discharge Medications: Please see discharge summary for a list of discharge medications.  Relevant Imaging Results:  Relevant Lab Results:   Additional Information SS#: [id number]  [facility], LCSW

## 2016-01-09 NOTE — Progress Notes (Signed)
Pt increasingly confused this evening. Pt pulled out foley urinary catheter and R AC IV. Paged Dr. Darnelle Catalanama - said not to reinsert catheter. Informed oncoming RN to watch for urinary retention - if no void in 6 hours will bladder scan. R AC site has clean, dry dressing intact. Applied safety mittens to patient and moved to camera room.   Leonidas Rombergaitlin S Bumbledare, RN

## 2016-01-09 NOTE — Evaluation (Signed)
Clinical/Bedside Swallow Evaluation Patient Details  Name: Elizabeth HabermannHelen S Noga MRN: [id number] Date of Birth: 02/02/1923  Today's Date: 01/09/2016 Time: SLP Start Time (ACUTE ONLY): 0830 SLP Stop Time (ACUTE ONLY): 0847 SLP Time Calculation (min) (ACUTE ONLY): 17 min  Past Medical History:  Past Medical History  Diagnosis Date  . Hypertension   . Dementia    Past Surgical History:  Past Surgical History  Procedure Laterality Date  . Cholecystectomy    . Appendectomy     HPI:  82 y.o. female with medical history significant of hypertension and severe dementia. Of note patient was discharged from hospital on 12/29/2015 after admission for possible GI bleed, hypotension and mechanical fall; CT chest on 01/07/16 indicated bilateral pulmonary emboli. BSE ordered d/t AMS. Nursing stated she has been consuming meals without any dysphagia noted.   Assessment / Plan / Recommendation Clinical Impression   Pt without overt s/s of aspiration during BSE with consistencies assessed; thin via straw/solids observed only as pt was finishing her breakfast tray when SLP arrived; refused puree d/t full satiety per pt; oriented to self only and mild aspiration risk d/t cognitive deficits; Regular/thin diet continue with intermittent supervision during self-feeding recommended; no f/u from ST at this time as pt is tolerating current diet without compromise    Aspiration Risk  Mild aspiration risk    Diet Recommendation   Regular (heart healthy)/thin liquids  Medication Administration: Other (Comment) (as tolerated; whole/liquid)    Other  Recommendations Oral Care Recommendations: Oral care BID   Follow up Recommendations  None    Frequency and Duration   n/a         Prognosis Prognosis for Safe Diet Advancement: Good      Swallow Study   General Date of Onset: 01/07/16 HPI: 82 y.o. female with medical history significant of hypertension and severe dementia. Of note patient was discharged from  hospital on 12/29/2015 after admission for possible GI bleed, hypotension and mechanical fall.  Type of Study: Bedside Swallow Evaluation Previous Swallow Assessment: n/a Diet Prior to this Study: Regular;Thin liquids Temperature Spikes Noted: No Respiratory Status: Room air History of Recent Intubation: No Behavior/Cognition: Alert;Cooperative;Confused Oral Cavity Assessment: Within Functional Limits Oral Care Completed by SLP: No Oral Cavity - Dentition: Missing dentition Vision: Functional for self-feeding Self-Feeding Abilities: Needs set up Patient Positioning: Upright in bed Baseline Vocal Quality: Normal Volitional Cough: Strong Volitional Swallow: Able to elicit    Oral/Motor/Sensory Function Overall Oral Motor/Sensory Function: Within functional limits   Ice Chips Ice chips: Not tested   Thin Liquid Thin Liquid: Within functional limits Presentation: Straw    Nectar Thick Nectar Thick Liquid: Not tested   Honey Thick Honey Thick Liquid: Not tested   Puree Puree: Not tested   Solid      Solid: Within functional limits Presentation: Self Fed Other Comments:  (Pt finishing her breakfast tray; refused puree)        Jalen Daluz,PAT, M.S., CCC-SLP 01/09/2016,8:59 AM

## 2016-01-09 NOTE — Evaluation (Signed)
Occupational Therapy Evaluation Patient Details Name: Elizabeth Booth MRN: [id number] DOB: 10/22/1922 Today's Date: 01/09/2016    History of Present Illness 82 y.o. female with medical history significant of hypertension and severe dementia. Of note patient was discharged from hospital on 12/29/2015 after admission for possible GI bleed, hypotension and mechanical fall; CT chest on 01/07/16 indicated bilateral pulmonary emboli.   Clinical Impression   Pt admitted with above. She demonstrates the below listed deficits and will benefit from continued OT to maximize safety and independence with BADLs.  Pt presents to OT with generalized weakness, impaired activity tolerance, pain bil. LEs, impaired balance, impaired cognition.  She currently requires max A, overall for ADLs.  Attempted to transfer her to chair, with PT, however, pt required total A +2 to achieve only partial stand - unable to extend hips and knees fully.  Recommend SNF level rehab at discharge.       Follow Up Recommendations  SNF    Equipment Recommendations  None recommended by OT    Recommendations for Other Services       Precautions / Restrictions Precautions Precautions: Fall      Mobility Bed Mobility Overal bed mobility: Needs Assistance Bed Mobility: Supine to Sit;Sit to Supine     Supine to sit: Max assist;+2 for physical assistance Sit to supine: Total assist;+2 for physical assistance   General bed mobility comments: Pt requires assist for all aspects   Transfers Overall transfer level: Needs assistance Equipment used: Rolling walker (2 wheeled);1 person hand held assist Transfers: Sit to/from Stand Sit to Stand: Total assist;+2 physical assistance         General transfer comment: Attempted to stand x 3.  Pt required total A +2 to achieve partial stand each time.  She is unable to extend hips and knees fully     Balance Overall balance assessment: Needs assistance Sitting-balance support:  Feet supported Sitting balance-Leahy Scale: Poor Sitting balance - Comments: Posterior LOB- requires Mod A for sitting balance. Pt lethargic. Postural control: Posterior lean Standing balance support: Bilateral upper extremity supported Standing balance-Leahy Scale: Zero Standing balance comment: unable to attain full standing                             ADL Overall ADL's : Needs assistance/impaired Eating/Feeding: Minimal assistance;Bed level   Grooming: Wash/dry hands;Wash/dry face;Oral care;Brushing hair;Moderate assistance;Sitting   Upper Body Bathing: Maximal assistance;Sitting   Lower Body Bathing: Total assistance;Sit to/from stand   Upper Body Dressing : Maximal assistance;Sitting   Lower Body Dressing: Total assistance;Bed level   Toilet Transfer: Total assistance Toilet Transfer Details (indicate cue type and reason): unable to safely attempt  Toileting- Clothing Manipulation and Hygiene: Total assistance;Bed level Toileting - Clothing Manipulation Details (indicate cue type and reason): Pt incontinent small amount of stool. Assisted with peri care in supine      Functional mobility during ADLs: Total assistance;+2 for physical assistance       Vision     Perception     Praxis      Pertinent Vitals/Pain Pain Assessment: Faces Faces Pain Scale: Hurts even more Pain Location: bil. LEs Pain Descriptors / Indicators: Moaning;Guarding;Grimacing Pain Intervention(s): Monitored during session;Repositioned     Hand Dominance Right   Extremity/Trunk Assessment Upper Extremity Assessment Upper Extremity Assessment: Generalized weakness   Lower Extremity Assessment Lower Extremity Assessment: Defer to PT evaluation   Cervical / Trunk Assessment Cervical / Trunk Assessment: Kyphotic  Communication Communication Communication: HOH   Cognition Arousal/Alertness: Awake/alert Behavior During Therapy: WFL for tasks assessed/performed Overall  Cognitive Status: History of cognitive impairments - at baseline                     General Comments       Exercises       Shoulder Instructions      Home Living Family/patient expects to be discharged to:: Skilled nursing facility                                        Prior Functioning/Environment Level of Independence: Independent        Comments: Elizabeth Booth reports pt was fully independent PTA     OT Diagnosis: Generalized weakness;Cognitive deficits;Acute pain   OT Problem List: Decreased strength;Decreased activity tolerance;Impaired balance (sitting and/or standing);Decreased coordination;Decreased cognition;Decreased safety awareness;Decreased knowledge of use of DME or AE;Decreased knowledge of precautions;Pain   OT Treatment/Interventions: Self-care/ADL training;Therapeutic exercise;DME and/or AE instruction;Therapeutic activities;Cognitive remediation/compensation;Patient/family education;Balance training    OT Goals(Current goals can be found in the care plan section) Acute Rehab OT Goals Patient Stated Goal: none stated OT Goal Formulation: With patient/family Time For Goal Achievement: 01/23/16 Potential to Achieve Goals: Fair ADL Goals Pt Will Perform Eating: with modified independence;sitting Pt Will Perform Grooming: with supervision;sitting Pt Will Perform Upper Body Bathing: with supervision;sitting Pt Will Transfer to Toilet: with mod assist;stand pivot transfer;bedside commode  OT Frequency: Min 2X/week   Barriers to D/C: Decreased caregiver support          Co-evaluation              End of Session Equipment Utilized During Treatment: Rolling walker;Gait belt Nurse Communication: Mobility status;Need for lift equipment  Activity Tolerance: Patient limited by pain Patient left: in bed;with call bell/phone within reach;with family/visitor present   Time: 1610-9604 OT Time Calculation (min): 27 min Charges:  OT  General Charges $OT Visit: 1 Procedure OT Evaluation $OT Eval Moderate Complexity: 1 Procedure G-Codes:    [name] 22-Jan-2016, 2:49 PM

## 2016-01-09 NOTE — Discharge Instructions (Signed)
Information on my medicine - ELIQUIS (apixaban)  This medication education was reviewed with me or my healthcare representative as part of my discharge preparation.   Why was Eliquis prescribed for you? Eliquis was prescribed to treat blood clots that may have been found in the veins of your legs (deep vein thrombosis) or in your lungs (pulmonary embolism) and to reduce the risk of them occurring again.  What do You need to know about Eliquis ? The starting dose is 10 mg (two 5 mg tablets) taken TWICE daily for the FIRST SEVEN (7) DAYS, then on (enter date) 6/23  the dose is reduced to ONE 5 mg tablet taken TWICE daily.  Eliquis may be taken with or without food.   Try to take the dose about the same time in the morning and in the evening. If you have difficulty swallowing the tablet whole please discuss with your pharmacist how to take the medication safely.  Take Eliquis exactly as prescribed and DO NOT stop taking Eliquis without talking to the doctor who prescribed the medication.  Stopping may increase your risk of developing a new blood clot.  Refill your prescription before you run out.  After discharge, you should have regular check-up appointments with your healthcare provider that is prescribing your Eliquis.    What do you do if you miss a dose? If a dose of ELIQUIS is not taken at the scheduled time, take it as soon as possible on the same day and twice-daily administration should be resumed. The dose should not be doubled to make up for a missed dose.  Important Safety Information A possible side effect of Eliquis is bleeding. You should call your healthcare provider right away if you experience any of the following: ? Bleeding from an injury or your nose that does not stop. ? Unusual colored urine (red or dark brown) or unusual colored stools (red or black). ? Unusual bruising for unknown reasons. ? A serious fall or if you hit your head (even if there is no  bleeding).  Some medicines may interact with Eliquis and might increase your risk of bleeding or clotting while on Eliquis. To help avoid this, consult your healthcare provider or pharmacist prior to using any new prescription or non-prescription medications, including herbals, vitamins, non-steroidal anti-inflammatory drugs (NSAIDs) and supplements.  This website has more information on Eliquis (apixaban): http://www.eliquis.com/eliquis/home

## 2016-01-09 NOTE — Progress Notes (Signed)
Pharmacy Antibiotic Note  Elizabeth HabermannHelen S Booth is a 82 y.o. female admitted on 01/07/2016 with UTI.  Pharmacy has been consulted for levaquin dosing. Pt is afebrile and WBC is WNL.   Plan: - Levaquin 250mg  IV Q24H -F/u renal fxn, C&S, clinical status      Temp (24hrs), Avg:97.9 F (36.6 C), Min:97.7 F (36.5 C), Max:98.2 F (36.8 C)   Recent Labs Lab 01/07/16 0135 01/08/16 0348 01/09/16 0437  WBC 12.8* 9.4 8.0  CREATININE 1.00 0.83 0.65    Estimated Creatinine Clearance: 36.2 mL/min (by C-G formula based on Cr of 0.65).    Allergies  Allergen Reactions  . Penicillins Anaphylaxis    Antimicrobials this admission: Levaquin 6/16>> CTX 6/14 >>6/16 Cipro x 1 6/14  Dose adjustments this admission: N/A  Microbiology results: 6/14 urine - enterococcus + staph aureus 6/14 GI panel - pos adenovirus 6/14 CDiff - NEG  Thank you for allowing pharmacy to be a part of this patient's care.  Elizabeth Booth, Elizabeth LeachRachel Booth 01/09/2016 8:43 AM

## 2016-01-09 NOTE — Clinical Documentation Improvement (Signed)
Hospitalist  Please document query responses in the progress notes and discharge summary, not on the CDI BPA form in [facility].    CMS requires that the Location and POA status of pressure ulcers must be documented by an attending medical provider.  "Pressure ulcer" is documented in the MD progress note 01/08/16.  WOC nurse assessment is dated 01/08/16. Unstageable pressure ulcers to right and left heels, POA is documented.  Suggested care involves painting the ulcers with betadine, allowing it to dry and using Prevalon boots for offloading at all times.  If you agree with the WOC nurse's assessment, please document the Location and Present on Admission status of the pressure ulcers noted:  - Bilateral heel pressure ulcers, POA  - Bilateral heel pressure ulcers, POA, suspected to be stage 3-4 ulcers   - Other condition  - Unable to clinically determine    Please exercise your independent, professional judgment when responding. A specific answer is not anticipated or expected.   Thank You, Jerral Ralphathy R Isebella U[place]hur  RN BSN [facility] [phone] Health Information Management [place]

## 2016-01-10 DIAGNOSIS — A09 Infectious gastroenteritis and colitis, unspecified: Secondary | ICD-10-CM

## 2016-01-10 DIAGNOSIS — I2699 Other pulmonary embolism without acute cor pulmonale: Secondary | ICD-10-CM | POA: Diagnosis not present

## 2016-01-10 DIAGNOSIS — A082 Adenoviral enteritis: Secondary | ICD-10-CM | POA: Diagnosis not present

## 2016-01-10 DIAGNOSIS — F0391 Unspecified dementia with behavioral disturbance: Secondary | ICD-10-CM | POA: Diagnosis not present

## 2016-01-10 DIAGNOSIS — N8184 Pelvic muscle wasting: Secondary | ICD-10-CM

## 2016-01-10 DIAGNOSIS — R339 Retention of urine, unspecified: Secondary | ICD-10-CM | POA: Diagnosis not present

## 2016-01-10 DIAGNOSIS — I10 Essential (primary) hypertension: Secondary | ICD-10-CM

## 2016-01-10 DIAGNOSIS — R410 Disorientation, unspecified: Secondary | ICD-10-CM | POA: Diagnosis not present

## 2016-01-10 LAB — BASIC METABOLIC PANEL
Anion gap: 6 (ref 5–15)
BUN: 10 mg/dL (ref 6–20)
CHLORIDE: 107 mmol/L (ref 101–111)
CO2: 26 mmol/L (ref 22–32)
Calcium: 8.4 mg/dL — ABNORMAL LOW (ref 8.9–10.3)
Creatinine, Ser: 0.62 mg/dL (ref 0.44–1.00)
GFR calc non Af Amer: >60 mL/min (ref >60)
Glucose, Bld: 103 mg/dL — ABNORMAL HIGH (ref 65–99)
POTASSIUM: 3.8 mmol/L (ref 3.5–5.1)
SODIUM: 139 mmol/L (ref 135–145)

## 2016-01-10 LAB — URINE CULTURE: Culture: >=100000 — AB

## 2016-01-10 LAB — CBC
HCT: 35.2 % — ABNORMAL LOW (ref 36.0–46.0)
HEMOGLOBIN: 10.6 g/dL — AB (ref 12.0–15.0)
MCH: 28.6 pg (ref 26.0–34.0)
MCHC: 30.1 g/dL (ref 30.0–36.0)
MCV: 94.9 fL (ref 78.0–100.0)
Platelets: 348 10*3/uL (ref 150–400)
RBC: 3.71 MIL/uL — AB (ref 3.87–5.11)
RDW: 13.8 % (ref 11.5–15.5)
WBC: 6.1 10*3/uL (ref 4.0–10.5)

## 2016-01-10 LAB — URINE MICROSCOPIC-ADD ON: Bacteria, UA: NONE SEEN

## 2016-01-10 LAB — URINALYSIS, ROUTINE W REFLEX MICROSCOPIC
BILIRUBIN URINE: NEGATIVE
Glucose, UA: NEGATIVE mg/dL
Ketones, ur: 15 mg/dL — AB
Leukocytes, UA: NEGATIVE
NITRITE: NEGATIVE
PROTEIN: NEGATIVE mg/dL
SPECIFIC GRAVITY, URINE: 1.013 (ref 1.005–1.030)
pH: 6.5 (ref 5.0–8.0)

## 2016-01-10 MED ORDER — TRAMADOL HCL 50 MG PO TABS: 25.0000 mg | ORAL_TABLET | Freq: Four times a day (QID) | ORAL | Status: DC | PRN

## 2016-01-10 NOTE — Progress Notes (Signed)
Progress Note    Elizabeth Booth  MEB:[id number] DOB: 08-26-1922  DOA: 01/07/2016 PCP: Irven Shelling, MD    Brief Narrative:   Elizabeth Booth is an 82 y.o. female with a PMH of hypertension and severe dementia, recent hospitalization 12/29/15-01/01/16 for evaluation of melena (family refused EGD during that visit, hemoglobin stabilized without further melanotic stools and therefore she was discharged), who was readmitted 01/07/16 with a chief complaint of dark stools and knee pain. Of note, she was FOBT negative (had been taking Pepto Bismol), and CT scan of abdomen and pelvis and chest fairly unremarkable other than apparent pelvic floor dysfunction and CTA chest showing pulmonary emboli.  Assessment/Plan:   Principal Problem:   Bilateral pulmonary embolism ([place]) with probable DVT resulting in knee pain Suspect these come from patient's recent bedbound state. Patient with new found extensive lower extremity edema predominantly on the left suggesting left lower extremity DVT as a primary source for PE. Initially placed on heparin drip, but this was changed to Eliquis.  Eliquis subsequently discontinued due to ongoing black stools, But now resumed. Hemoglobin stable on Eliquis.  Active Problems:   Melena/black diarrhea Repeat fecal occult blood testing negative. Continue to monitor hemoglobin/hematocrit.  C. difficile negative. GI pathogen panel positive for enteric adenovirus. Continue supportive care and contact precautions.    Hypokalemia Resolved with potassium added to IV fluids.    Severe malnutrition Evaluated by dietitian. Supplements ordered.    Hypertension Hydralazine IV ordered as needed.    Dementia with behavioral disturbance/delirium Advanced. On Zyprexa and Ativan. Also on low-dose Haldol as needed for agitation. Discontinue Ativan due to excessive sedation with this medication.    DNR (do not resuscitate) discussion The patient's daughter apparently has been  inconsistent and changes her mind frequently with regard to the patient's CODE STATUS. May need an ethics consultation and social worker consult given her complex social situation. Met with the patient's daughter and the palliative care team 01/09/16 to provide education regarding CODE STATUS and the patient daughter currently agrees that her mother would not want to be resuscitated in the event of a cardiopulmonary arrest. Patient's daughter is also agreeable for rehabilitation placement.    Urinary retention/Pelvic floor dysfunction Catheter in place.    Knee pain Likely from DVT.    Bilateral heel pressure ulcers, present on admission The patient has unstageable pressure injuries to her bilateral Achilles area. Wound care per wound care nurse recommendations. Prevalon boots for offloading at all times.    UTI, cultures positive for enterococcus and staph aureus Initially placed on Rocephin. Antibiotics switched to Levaquin 01/09/16.   Family Communication/Anticipated D/C date and plan/Code Status   DVT prophylaxis: SCDs ordered. Code Status: DNR  Family Communication: Daughter updated at the bedside 01/09/16. Disposition Plan: SNF placement when bed available.   Medical Consultants:    Palliative Care   Procedures:    Anti-Infectives:   Rocephin 01/07/16--->01/09/16 Levaquin 01/09/16--->   Subjective:   Elizabeth Booth is very sedated today, nonverbal.  Objective:    Filed Vitals:   01/09/16 0837 01/09/16 1400 01/09/16 2124 01/10/16 0609  BP:  137/76 159/80 146/66  Pulse:  77 96 88  Temp:  98.1 F (36.7 C) 97.5 F (36.4 C) 98.2 F (36.8 C)  TempSrc:  Oral Axillary Oral  Resp:  '24 18 20  '$ SpO2: 99% 94% 100% 96%    Intake/Output Summary (Last 24 hours) at 01/10/16 0947 Last data filed at 01/10/16 0229  Gross per  24 hour  Intake    122 ml  Output    900 ml  Net   -778 ml   There were no vitals filed for this visit.  Exam: General exam: Sedated.    Respiratory system: Clear to auscultation. Respiratory effort increased with deep inspirations. Cardiovascular system: S1 & S2 heard, RRR. No JVD,  rubs, gallops or clicks. No murmurs. Gastrointestinal system: Abdomen is nondistended, soft and nontender. No organomegaly or masses felt. Normal bowel sounds heard.  Central nervous system: Sedated. No focal neurological deficits. Extremities: No clubbing, edema, or cyanosis. Skin: Large eccymosis posterior right thigh. Psychiatry: Unable to assess.   Data Reviewed:   I have personally reviewed following labs and imaging studies:  Labs: Basic Metabolic Panel:  Recent Labs Lab 01/07/16 0135 01/08/16 0348 01/09/16 0437 01/10/16 0300  NA 138 141 139 139  K 3.9 3.9 3.4* 3.8  CL 104 107 108 107  CO2 '24 24 25 26  '$ GLUCOSE 100* 83 99 103*  BUN 27* '18 14 10  '$ CREATININE 1.00 0.83 0.65 0.62  CALCIUM 8.6* 8.4* 8.2* 8.4*   GFR Estimated Creatinine Clearance: 36.2 mL/min (by C-G formula based on Cr of 0.62). Liver Function Tests:  Recent Labs Lab 01/07/16 0135  AST 29  ALT 25  ALKPHOS 86  BILITOT 0.5  PROT 5.8*  ALBUMIN 2.1*   Coagulation profile  Recent Labs Lab 01/07/16 0135  INR 1.49    CBC:  Recent Labs Lab 01/07/16 0135 01/08/16 0348 01/09/16 0437 01/10/16 0300  WBC 12.8* 9.4 8.0 6.1  NEUTROABS 10.2*  --   --   --   HGB 10.9* 10.9* 9.8* 10.6*  HCT 35.2* 36.4 32.3* 35.2*  MCV 92.6 96.0 95.6 94.9  PLT 238 262 283 348   Cardiac Enzymes:  Recent Labs Lab 01/07/16 0506  TROPONINI 0.03   Urine analysis:    Component Value Date/Time   COLORURINE YELLOW 01/07/2016 0501   APPEARANCEUR TURBID* 01/07/2016 0501   LABSPEC 1.041* 01/07/2016 0501   PHURINE 5.5 01/07/2016 0501   GLUCOSEU NEGATIVE 01/07/2016 0501   HGBUR LARGE* 01/07/2016 0501   BILIRUBINUR NEGATIVE 01/07/2016 0501   KETONESUR 15* 01/07/2016 0501   PROTEINUR NEGATIVE 01/07/2016 0501   NITRITE NEGATIVE 01/07/2016 0501   LEUKOCYTESUR LARGE*  01/07/2016 0501   Microbiology Recent Results (from the past 240 hour(s))  C difficile quick scan w PCR reflex     Status: None   Collection Time: 01/07/16  5:01 AM  Result Value Ref Range Status   C Diff antigen NEGATIVE NEGATIVE Final   C Diff toxin NEGATIVE NEGATIVE Final   C Diff interpretation Negative for toxigenic C. difficile  Final  Gastrointestinal Panel by PCR , Stool     Status: Abnormal   Collection Time: 01/07/16  5:01 AM  Result Value Ref Range Status   Campylobacter species NOT DETECTED NOT DETECTED Final   Plesimonas shigelloides NOT DETECTED NOT DETECTED Final   Salmonella species NOT DETECTED NOT DETECTED Final   Yersinia enterocolitica NOT DETECTED NOT DETECTED Final   Vibrio species NOT DETECTED NOT DETECTED Final   Vibrio cholerae NOT DETECTED NOT DETECTED Final   Enteroaggregative E coli (EAEC) NOT DETECTED NOT DETECTED Final   Enteropathogenic E coli (EPEC) NOT DETECTED NOT DETECTED Final   Enterotoxigenic E coli (ETEC) NOT DETECTED NOT DETECTED Final   Shiga like toxin producing E coli (STEC) NOT DETECTED NOT DETECTED Final   E. coli O157 NOT DETECTED NOT DETECTED Final   Shigella/Enteroinvasive  E coli (EIEC) NOT DETECTED NOT DETECTED Final   Cryptosporidium NOT DETECTED NOT DETECTED Final   Cyclospora cayetanensis NOT DETECTED NOT DETECTED Final   Entamoeba histolytica NOT DETECTED NOT DETECTED Final   Giardia lamblia NOT DETECTED NOT DETECTED Final   Adenovirus F40/41 DETECTED (A) NOT DETECTED Final   Astrovirus NOT DETECTED NOT DETECTED Final   Norovirus GI/GII NOT DETECTED NOT DETECTED Final   Rotavirus A NOT DETECTED NOT DETECTED Final   Sapovirus (I, II, IV, and V) NOT DETECTED NOT DETECTED Final  Urine culture     Status: Abnormal   Collection Time: 01/07/16  5:01 AM  Result Value Ref Range Status   Specimen Description URINE, CATHETERIZED  Final   Special Requests ADDED [id number]  Final   Culture (A)  Final    >=100,000 COLONIES/mL  ENTEROCOCCUS SPECIES 60,000 COLONIES/mL STAPHYLOCOCCUS AUREUS    Report Status 01/10/2016 FINAL  Final   Organism ID, Bacteria ENTEROCOCCUS SPECIES (A)  Final   Organism ID, Bacteria STAPHYLOCOCCUS AUREUS (A)  Final      Susceptibility   Staphylococcus aureus - MIC*    CIPROFLOXACIN <=0.5 SENSITIVE Sensitive     GENTAMICIN <=0.5 SENSITIVE Sensitive     NITROFURANTOIN <=16 SENSITIVE Sensitive     OXACILLIN <=0.25 SENSITIVE Sensitive     TETRACYCLINE <=1 SENSITIVE Sensitive     VANCOMYCIN <=0.5 SENSITIVE Sensitive     TRIMETH/SULFA <=10 SENSITIVE Sensitive     CLINDAMYCIN <=0.25 SENSITIVE Sensitive     RIFAMPIN <=0.5 SENSITIVE Sensitive     Inducible Clindamycin NEGATIVE Sensitive     * 60,000 COLONIES/mL STAPHYLOCOCCUS AUREUS   Enterococcus species - MIC*    AMPICILLIN <=2 SENSITIVE Sensitive     LEVOFLOXACIN 1 SENSITIVE Sensitive     NITROFURANTOIN <=16 SENSITIVE Sensitive     VANCOMYCIN 1 SENSITIVE Sensitive     * >=100,000 COLONIES/mL ENTEROCOCCUS SPECIES    Radiology: No results found.  Medications:   . apixaban  10 mg Oral BID   Followed by  . [START ON 01/16/2016] apixaban  5 mg Oral BID  . feeding supplement (ENSURE ENLIVE)  237 mL Oral TID BM  . levofloxacin (LEVAQUIN) IV  250 mg Intravenous Q24H  . OLANZapine  5 mg Oral QHS  . sodium chloride flush  3 mL Intravenous Q12H   Continuous Infusions: . 0.9 % NaCl with KCl 20 mEq / L 75 mL/hr at 01/09/16 2136    Time spent: 25 minutes.    LOS: 3 days   [facility] Pager [phone]. If unable to reach me by pager, please call my cell phone at [phone].  *Please refer to [email or web address], password [email or web address] to get updated schedule on who will round on this patient, as hospitalists switch teams weekly. If 7PM-7AM, please contact night-coverage at [email or web address], password [email or web address] for any overnight needs.  01/10/2016, 9:47 AM

## 2016-01-11 ENCOUNTER — Encounter (HOSPITAL_COMMUNITY): Payer: Self-pay | Admitting: Internal Medicine

## 2016-01-11 DIAGNOSIS — R410 Disorientation, unspecified: Secondary | ICD-10-CM | POA: Diagnosis not present

## 2016-01-11 DIAGNOSIS — A082 Adenoviral enteritis: Secondary | ICD-10-CM | POA: Diagnosis not present

## 2016-01-11 DIAGNOSIS — R339 Retention of urine, unspecified: Secondary | ICD-10-CM | POA: Diagnosis not present

## 2016-01-11 DIAGNOSIS — F0391 Unspecified dementia with behavioral disturbance: Secondary | ICD-10-CM | POA: Diagnosis not present

## 2016-01-11 DIAGNOSIS — I2699 Other pulmonary embolism without acute cor pulmonale: Secondary | ICD-10-CM | POA: Diagnosis not present

## 2016-01-11 LAB — URINE CULTURE: Culture: NO GROWTH

## 2016-01-11 MED ORDER — APIXABAN 5 MG PO TABS
10.0000 mg | ORAL_TABLET | Freq: Two times a day (BID) | ORAL | Status: DC
Start: 2016-01-11 — End: 2016-01-21

## 2016-01-11 MED ORDER — LEVOFLOXACIN 250 MG PO TABS: 250.0000 mg | ORAL_TABLET | Freq: Every day | ORAL | Status: AC

## 2016-01-11 MED ORDER — ENSURE ENLIVE PO LIQD: 237.0000 mL | Freq: Three times a day (TID) | ORAL | Status: AC

## 2016-01-11 MED ORDER — ACETAMINOPHEN 325 MG PO TABS: 650.0000 mg | ORAL_TABLET | Freq: Four times a day (QID) | ORAL | Status: AC | PRN

## 2016-01-11 MED ORDER — APIXABAN 5 MG PO TABS: 5.0000 mg | ORAL_TABLET | Freq: Two times a day (BID) | ORAL | Status: DC

## 2016-01-11 MED ORDER — OLANZAPINE 5 MG PO TABS: 5.0000 mg | ORAL_TABLET | Freq: Every day | ORAL | Status: AC

## 2016-01-11 NOTE — Progress Notes (Signed)
Patient incont. Small black soft stool

## 2016-01-11 NOTE — Progress Notes (Signed)
Patient had stool smear small amt. Black soft

## 2016-01-11 NOTE — Discharge Summary (Signed)
Physician Discharge Summary  Elizabeth Booth QMV:[id number] DOB: Oct 09, 1922 DOA: 01/07/2016  PCP: Irven Shelling, MD  Admit date: 01/07/2016 Discharge date: 01/11/2016   Recommendations for Outpatient Follow-Up:   1. Recommend that the patient be followed by the Palliative Care Team at the facility to establish goals of care in case her condition deteriorates further and she experiences further functional decline. 2. Monitor CBC weekly to assess stability of hemoglobin on blood thinners.   Discharge Diagnosis:   Principal Problem:    Bilateral pulmonary embolism ([place]) with probable DVT resulting in knee pain Active Problems:    Hypertension    DNR (do not resuscitate) discussion    Urinary retention    Pelvic floor dysfunction    Dementia with behavioral disturbance    Diarrhea    Delirium    Protein-calorie malnutrition, severe    UTI (urinary tract infection)    Adenovirus enteritis    Acute delirium    Encounter for hospice care discussion    Hypokalemia    Pressure ulcer of both heels, unstageable ([place]), present on admission   Discharge disposition: SNF: [facility]  Discharge Condition: Improved.  Diet recommendation: Low sodium, heart healthy.  Carbohydrate-modified.  Regular.  Wound care: Paint heel ulcers with betadine swab and allow to air dry. Prevalon boots for offloading at all times.    History of Present Illness:   Elizabeth Booth is an 82 y.o. female with a PMH of hypertension and severe dementia, recent hospitalization 12/29/15-01/01/16 for evaluation of melena (family refused EGD during that visit, hemoglobin stabilized without further melanotic stools and therefore she was discharged), who was readmitted 01/07/16 with a chief complaint of dark stools and knee pain. Of note, she was FOBT negative (had been taking Pepto Bismol), and CT scan of abdomen and pelvis and chest fairly unremarkable other than apparent pelvic floor dysfunction and  CTA chest showing pulmonary emboli.   Hospital Course by Problem:   Principal Problem:  Bilateral pulmonary embolism ([place]) with probable DVT resulting in knee pain Suspect these come from patient's recent bedbound state. Patient with new found extensive lower extremity edema predominantly on the left suggesting left lower extremity DVT as a primary source for PE. Initially placed on heparin drip, but this was changed to Eliquis. Eliquis subsequently discontinued due to ongoing black stools, but now resumed (FOBT negative x 2). Hemoglobin stable on Eliquis.  Active Problems:  Melena/black diarrhea Repeat fecal occult blood testing negative. Continue to monitor hemoglobin/hematocrit. C. difficile negative. GI pathogen panel positive for enteric adenovirus. Continue supportive care and contact precautions.   Hypokalemia Resolved with potassium added to IV fluids.   Severe malnutrition Evaluated by dietitian. Supplements ordered.   Hypertension Hydralazine IV ordered as needed.   Dementia with behavioral disturbance/delirium Advanced. On Zyprexa and Ativan. Given low-dose Haldol as needed for agitation. Ativan discontinued due to excessive sedation with this medication.   DNR (do not resuscitate) discussion The patient's daughter apparently has been inconsistent and changes her mind frequently with regard to the patient's CODE STATUS. May need an ethics consultation and social worker consult given her complex social situation. Met with the patient's daughter and the palliative care team 01/09/16 to provide education regarding CODE STATUS and the patient daughter currently agrees that her mother would not want to be resuscitated in the event of a cardiopulmonary arrest. Patient's daughter is also agreeable for rehabilitation placement.   Urinary retention/Pelvic floor dysfunction Catheter in place.   Knee pain Likely from DVT.  Bilateral heel pressure ulcers, present on  admission The patient has unstageable pressure injuries to her bilateral Achilles area. Wound care per wound care nurse recommendations. Prevalon boots for offloading at all times.   UTI, cultures positive for enterococcus and staph aureus Initially placed on Rocephin. Antibiotics switched to Levaquin 01/09/16. Will complete 4 more days of therapy.    Medical Consultants:    None.   Discharge Exam:   Filed Vitals:   01/11/16 0419 01/11/16 1000  BP: 147/58 128/45  Pulse: 85 81  Temp: 98.4 F (36.9 C) 98 F (36.7 C)  Resp: 20 20   Filed Vitals:   01/10/16 1356 01/10/16 2245 01/11/16 0419 01/11/16 1000  BP: 155/57 117/47 147/58 128/45  Pulse: 82 86 85 81  Temp: 94.5 F (34.7 C) 98.3 F (36.8 C) 98.4 F (36.9 C) 98 F (36.7 C)  TempSrc: Axillary Oral Axillary Oral  Resp: '20 20 20 20  '$ SpO2: 98% 98% 100% 99%    Gen:  NAD, sleepy, confused Cardiovascular:  RRR, No M/R/G Respiratory: Lungs CTAB Gastrointestinal: Abdomen soft, NT/ND with normal active bowel sounds. Extremities: No C/E/C   The results of significant diagnostics from this hospitalization (including imaging, microbiology, ancillary and laboratory) are listed below for reference.     Procedures and Diagnostic Studies:   Dg Knee 1-2 Views Left  01/07/2016  CLINICAL DATA:  Pain EXAM: LEFT KNEE - 1-2 VIEW COMPARISON:  None. FINDINGS: Frontal and lateral views were obtained. There is no fracture or dislocation. There is a joint effusion. There is soft rather marked joint space narrowing medially. Other joint spaces appear normal. There are multiple foci of chondrocalcinosis. There is a small spur along the anterior superior patella. IMPRESSION: Osteoarthritic change with rather marked narrowing medially. Small joint effusion. Chondrocalcinosis is present. Chondrocalcinosis may be seen with osteoarthritis but also may be indicative of calcium pyrophosphate deposition disease. Electronically Signed   By: Lowella Grip III M.D.   On: 01/07/2016 09:33   Dg Knee 1-2 Views Right  01/07/2016  CLINICAL DATA:  Knee pain bilaterally.  Dementia. EXAM: RIGHT KNEE - 1-2 VIEW COMPARISON:  12/29/2015 FINDINGS: Knee joint effusion is moderate and has decreased from prior. No fracture deformity or subluxation. Advanced knee osteoarthritis with near bone-on-bone contact at the medial compartment. Spurring is diffuse and bulky. Subchondral cyst at the tibial eminence. Nonspecific subcutaneous reticulation of the leg that is increased. Chondrocalcinosis and osteopenia. IMPRESSION: 1. No acute osseous finding. 2. Knee joint effusion that is decreased from 12/29/2015. 3. Increased but nonspecific leg edema. 4. Advanced tricompartmental osteoarthritis. Electronically Signed   By: Monte Fantasia M.D.   On: 01/07/2016 09:32   Ct Angio Chest Pe W/cm &/or Wo Cm  01/07/2016  CLINICAL DATA:  Dementia and anxiety. Filling defect suggested in the right pulmonary artery on CT abdomen and pelvis. EXAM: CT ANGIOGRAPHY CHEST WITH CONTRAST TECHNIQUE: Multidetector CT imaging of the chest was performed using the standard protocol during bolus administration of intravenous contrast. Multiplanar CT image reconstructions and MIPs were obtained to evaluate the vascular anatomy. CONTRAST:  100 mL Isovue 370 COMPARISON:  CT abdomen and pelvis 01/07/2016 FINDINGS: Technically adequate study with good opacification of the central and segmental pulmonary arteries. Multiple filling defects are demonstrated in the right main pulmonary artery and within bilateral upper lobe and lower lobe segmental branches. Appearance is consistent with acute pulmonary embolus. No evidence of right heart strain. Normal heart size. Normal caliber thoracic aorta. Aortic calcification. Great vessel origins are  patent. No aortic dissection. Coronary artery calcifications. Esophagus is decompressed. Small esophageal hiatal hernia. No significant lymphadenopathy in the chest.  Evaluation of lungs is limited due to motion artifact. Emphysematous changes in the lungs. Atelectasis in the lung bases. No pleural effusions. No pneumothorax. Included portions of the upper abdominal organs are grossly unremarkable. Degenerative changes in the spine. No destructive bone lesions. Review of the MIP images confirms the above findings. IMPRESSION: Examination is positive for multiple bilateral pulmonary emboli, including right lobar pulmonary artery embolus. No evidence of right heart strain. These results were called by telephone at the time of interpretation on 01/07/2016 at 7:02 am to PA. Lattie Haw , who verbally acknowledged these results. Electronically Signed   By: Lucienne Capers M.D.   On: 01/07/2016 07:05   Ct Abdomen Pelvis W Contrast  01/07/2016  CLINICAL DATA:  Abdominal distention, black stools, diarrhea. Fall 2 weeks ago. EXAM: CT ABDOMEN AND PELVIS WITH CONTRAST TECHNIQUE: Multidetector CT imaging of the abdomen and pelvis was performed using the standard protocol following bolus administration of intravenous contrast. CONTRAST:  172m ISOVUE-300 IOPAMIDOL (ISOVUE-300) INJECTION 61% COMPARISON:  None. FINDINGS: Dependent atelectasis in the lung bases. Small esophageal hiatal hernia. Coronary artery calcifications. Pulmonary arteries are not entirely included within the field of view but there is suggestion of a filling defect in the right main pulmonary artery. Pulmonary embolus is not excluded. The liver, spleen, pancreas, adrenal glands, inferior vena cava, and retroperitoneal lymph nodes are unremarkable. Calcification of aorta without aneurysm. Left renal hydronephrosis with ureterectasis. No stones are identified. Possibly due to reflux. Non radiopaque stone or stricture could also account for this appearance. Stomach, small bowel, and colon are not abnormally distended. Contrast material flows through to the rectum without evidence of bowel obstruction. Diverticulosis of the colon  without inflammatory change. No free air or free fluid in the abdomen. Pelvis: The bladder is diffusely distended without wall thickening or filling defect. This may be physiologic or could indicate bowel obstruction or impaired bladder contraction. Uterus and ovaries are not enlarged. Calcifications in the uterus suggesting small fibroids. Appendix is not identified. Stool in the rectum with suggestion of pelvic floor descent. Contrast material in the gluteal crease likely due to incontinence. Degenerative changes in the spine. Old fractures of the left inferior pubic ramus. IMPRESSION: No evidence of bowel obstruction. Suggestion of pelvic floor descent and rectal incontinence. Diffusely fluid distended bladder. Left hydronephrosis and hydroureter. No stone identified. Possible reflux or obstruction due to an occult stone or stricture. Small esophageal hiatal hernia. Possible filling defect in the right main pulmonary artery could indicate pulmonary embolus. These results were called by telephone at the time of interpretation on 01/07/2016 at 3:38 am to Dr. KPryor Curia, who verbally acknowledged these results. Electronically Signed   By: WLucienne CapersM.D.   On: 01/07/2016 03:44     Labs:   Basic Metabolic Panel:  Recent Labs Lab 01/07/16 0135 01/08/16 0348 01/09/16 0437 01/10/16 0300  NA 138 141 139 139  K 3.9 3.9 3.4* 3.8  CL 104 107 108 107  CO2 '24 24 25 26  '$ GLUCOSE 100* 83 99 103*  BUN 27* '18 14 10  '$ CREATININE 1.00 0.83 0.65 0.62  CALCIUM 8.6* 8.4* 8.2* 8.4*   GFR Estimated Creatinine Clearance: 36.2 mL/min (by C-G formula based on Cr of 0.62). Liver Function Tests:  Recent Labs Lab 01/07/16 0135  AST 29  ALT 25  ALKPHOS 86  BILITOT 0.5  PROT 5.8*  ALBUMIN  2.1*   Coagulation profile  Recent Labs Lab 01/07/16 0135  INR 1.49    CBC:  Recent Labs Lab 01/07/16 0135 01/08/16 0348 01/09/16 0437 01/10/16 0300  WBC 12.8* 9.4 8.0 6.1  NEUTROABS 10.2*  --   --    --   HGB 10.9* 10.9* 9.8* 10.6*  HCT 35.2* 36.4 32.3* 35.2*  MCV 92.6 96.0 95.6 94.9  PLT 238 262 283 348   Cardiac Enzymes:  Recent Labs Lab 01/07/16 0506  TROPONINI 0.03   Microbiology Recent Results (from the past 240 hour(s))  C difficile quick scan w PCR reflex     Status: None   Collection Time: 01/07/16  5:01 AM  Result Value Ref Range Status   C Diff antigen NEGATIVE NEGATIVE Final   C Diff toxin NEGATIVE NEGATIVE Final   C Diff interpretation Negative for toxigenic C. difficile  Final  Gastrointestinal Panel by PCR , Stool     Status: Abnormal   Collection Time: 01/07/16  5:01 AM  Result Value Ref Range Status   Campylobacter species NOT DETECTED NOT DETECTED Final   Plesimonas shigelloides NOT DETECTED NOT DETECTED Final   Salmonella species NOT DETECTED NOT DETECTED Final   Yersinia enterocolitica NOT DETECTED NOT DETECTED Final   Vibrio species NOT DETECTED NOT DETECTED Final   Vibrio cholerae NOT DETECTED NOT DETECTED Final   Enteroaggregative E coli (EAEC) NOT DETECTED NOT DETECTED Final   Enteropathogenic E coli (EPEC) NOT DETECTED NOT DETECTED Final   Enterotoxigenic E coli (ETEC) NOT DETECTED NOT DETECTED Final   Shiga like toxin producing E coli (STEC) NOT DETECTED NOT DETECTED Final   E. coli O157 NOT DETECTED NOT DETECTED Final   Shigella/Enteroinvasive E coli (EIEC) NOT DETECTED NOT DETECTED Final   Cryptosporidium NOT DETECTED NOT DETECTED Final   Cyclospora cayetanensis NOT DETECTED NOT DETECTED Final   Entamoeba histolytica NOT DETECTED NOT DETECTED Final   Giardia lamblia NOT DETECTED NOT DETECTED Final   Adenovirus F40/41 DETECTED (A) NOT DETECTED Final   Astrovirus NOT DETECTED NOT DETECTED Final   Norovirus GI/GII NOT DETECTED NOT DETECTED Final   Rotavirus A NOT DETECTED NOT DETECTED Final   Sapovirus (I, II, IV, and V) NOT DETECTED NOT DETECTED Final  Urine culture     Status: Abnormal   Collection Time: 01/07/16  5:01 AM  Result Value Ref  Range Status   Specimen Description URINE, CATHETERIZED  Final   Special Requests ADDED [id number]  Final   Culture (A)  Final    >=100,000 COLONIES/mL ENTEROCOCCUS SPECIES 60,000 COLONIES/mL STAPHYLOCOCCUS AUREUS    Report Status 01/10/2016 FINAL  Final   Organism ID, Bacteria ENTEROCOCCUS SPECIES (A)  Final   Organism ID, Bacteria STAPHYLOCOCCUS AUREUS (A)  Final      Susceptibility   Staphylococcus aureus - MIC*    CIPROFLOXACIN <=0.5 SENSITIVE Sensitive     GENTAMICIN <=0.5 SENSITIVE Sensitive     NITROFURANTOIN <=16 SENSITIVE Sensitive     OXACILLIN <=0.25 SENSITIVE Sensitive     TETRACYCLINE <=1 SENSITIVE Sensitive     VANCOMYCIN <=0.5 SENSITIVE Sensitive     TRIMETH/SULFA <=10 SENSITIVE Sensitive     CLINDAMYCIN <=0.25 SENSITIVE Sensitive     RIFAMPIN <=0.5 SENSITIVE Sensitive     Inducible Clindamycin NEGATIVE Sensitive     * 60,000 COLONIES/mL STAPHYLOCOCCUS AUREUS   Enterococcus species - MIC*    AMPICILLIN <=2 SENSITIVE Sensitive     LEVOFLOXACIN 1 SENSITIVE Sensitive     NITROFURANTOIN <=16 SENSITIVE Sensitive  VANCOMYCIN 1 SENSITIVE Sensitive     * >=100,000 COLONIES/mL ENTEROCOCCUS SPECIES  Culture, Urine     Status: None   Collection Time: 01/10/16 10:56 AM  Result Value Ref Range Status   Specimen Description URINE, CLEAN CATCH  Final   Special Requests NONE  Final   Culture NO GROWTH  Final   Report Status 01/11/2016 FINAL  Final     Discharge Instructions:   Discharge Instructions    Call MD for:  extreme fatigue    Complete by:  As directed      Call MD for:  persistant dizziness or light-headedness    Complete by:  As directed      Call MD for:  persistant nausea and vomiting    Complete by:  As directed      Call MD for:  severe uncontrolled pain    Complete by:  As directed      Diet - low sodium heart healthy    Complete by:  As directed      Increase activity slowly    Complete by:  As directed             Medication List    TAKE  these medications        acetaminophen 325 MG tablet  Commonly known as:  TYLENOL  Take 2 tablets (650 mg total) by mouth every 6 (six) hours as needed for mild pain (or Fever >/= 101).     amLODipine 5 MG tablet  Commonly known as:  NORVASC  Take 1 tablet (5 mg total) by mouth daily.     apixaban 5 MG Tabs tablet  Commonly known as:  ELIQUIS  Take 2 tablets (10 mg total) by mouth 2 (two) times daily.     apixaban 5 MG Tabs tablet  Commonly known as:  ELIQUIS  Take 1 tablet (5 mg total) by mouth 2 (two) times daily.  Start taking on:  01/24/2016     feeding supplement (ENSURE ENLIVE) Liqd  Take 237 mLs by mouth 3 (three) times daily between meals.     levofloxacin 250 MG tablet  Commonly known as:  LEVAQUIN  Take 1 tablet (250 mg total) by mouth daily.     OLANZapine 5 MG tablet  Commonly known as:  ZYPREXA  Take 1 tablet (5 mg total) by mouth at bedtime.          Time coordinating discharge: 35 minutes.  Signed:  Lennell Shanks  Pager [phone] [facility] 01/11/2016, 12:14 PM

## 2016-01-11 NOTE — Progress Notes (Addendum)
Patient in stable condition, [facility], patient taken off the unit via EMS. Patient unable to answer adnission psychosocial questions

## 2016-01-11 NOTE — Clinical Social Work Note (Signed)
CSW has made arrangements for patient to go to [facility]. CSW has paged MD per consult to make aware of SNF arrangements. Waiting for call back. HTA authorization started.  Roddie McBryant Delyla Sandeen MSW, [place]CS[place], [place], [phone]

## 2016-01-11 NOTE — Clinical Social Work Placement (Signed)
   CLINICAL SOCIAL WORK PLACEMENT  NOTE  Date:  01/11/2016  Patient Details  Name: Elizabeth Booth MRN: [id number] Date of Birth: 06/26/1923  Clinical Social Work is seeking post-discharge placement for this patient at the Skilled  Nursing Facility level of care (*CSW will initial, date and re-position this form in  chart as items are completed):  Yes   Patient/family provided with [facility] Clinical Social Work Department's list of facilities offering this level of care within the geographic area requested by the patient (or if unable, by the patient's family).  Yes   Patient/family informed of their freedom to choose among providers that offer the needed level of care, that participate in Medicare, Medicaid or managed care program needed by the patient, have an available bed and are willing to accept the patient.  Yes   Patient/family informed of [facility]'s ownership interest in [facility] and [facility], as well as of the fact that they are under no obligation to receive care at these facilities.  PASRR submitted to EDS on 01/11/16     PASRR number received on 01/11/16     Existing PASRR number confirmed on       FL2 transmitted to all facilities in geographic area requested by pt/family on 01/11/16     FL2 transmitted to all facilities within larger geographic area on       Patient informed that his/her managed care company has contracts with or will negotiate with certain facilities, including the following:        Yes   Patient/family informed of bed offers received.  [facility]     Physician recommends and patient chooses bed at      [facility].  Patient to be transferred to facility by Ambulance     Patient family notified on 01/11/16 of transfer.  Name of family member notified:  [name]     PHYSICIAN Please prepare priority discharge summary, including  medications, Please prepare prescriptions, Please sign FL2, Please sign DNR     Additional Comment:  Per MD [facility]. RN, patient, patient's family, and facility notified of DC. RN given number for report. DC packet on chart. Ambulance transport requested for patient. CSW signing off.   _______________________________________________ Venita Lickampbell, Selma Mink B, LCSW 01/11/2016, 2:03 PM

## 2016-01-12 NOTE — Care Management Note (Signed)
Case Management Note Previous CM note initiated by Oletta Cohnamellia Wood RN, ED CM  Patient Details  Name: Elizabeth Booth MRN: [id number] Date of Birth: 06/18/1923  Subjective/Objective:                  82 y.o. female with medical history significant of hypertension and severe dementia. Of note patient was discharged from hospital on 12/29/2015 after admission for possible GI bleed, hypotension and mechanical fall. / From home with adult daughter.  Action/Plan: Follow for disposition needs. / Resumption of care with [facility] if pt returns home.    Expected Discharge Date:  01/11/16               Expected Discharge Plan:  Home w Home Health Services  In-House Referral:  Clinical Social Work  Discharge planning Services  CM Consult  Post Acute Care Choice:  Resumption of Svcs/PTA Provider, Home Health Choice offered to:     DME Arranged:    DME Agency:     HH Arranged:  RN, PT, OT, Nurse's Aide, Social Work [employer] Agency:  [facility]  Status of Service:  Completed, signed off  Medicare Important Message Given:    Date Medicare IM Given:    Medicare IM give by:    Date Additional Medicare IM Given:    Additional Medicare Important Message give by:     If discussed at Long Length of Stay Meetings, dates discussed:    Discharge Disposition: skilled facility   Additional Comments:  01/09/16- 1600- Donn PieriniKristi Fenris Cauble RN, BSN- plan for pt to d/c to SNF- CSW following for placement needs- daughter agreeable to STSNF- for rehab, pt has been started on Eliquis- benefits check completed for when pt returns home-- [facility] @ [employer] # [phone]   ELIQUIS 5 MG BID (30 )  COVER- YES  CO-PAY- $ 8.25  TIER- 3 DRUG  PRIOR APPROVAL - NO  [facility]      Tationa Stech, Lenn SinkKristi Hall, RN 01/12/2016, 10:48 AM

## 2016-01-16 ENCOUNTER — Encounter (HOSPITAL_COMMUNITY): Payer: Self-pay | Admitting: *Deleted

## 2016-01-16 ENCOUNTER — Emergency Department (HOSPITAL_COMMUNITY)
Admission: EM | Admit: 2016-01-16 | Discharge: 2016-01-16 | Disposition: A | Discharge status: Home / Self Care | Payer: PPO | Attending: Emergency Medicine | Admitting: Emergency Medicine

## 2016-01-16 DIAGNOSIS — R319 Hematuria, unspecified: Secondary | ICD-10-CM

## 2016-01-16 DIAGNOSIS — I10 Essential (primary) hypertension: Secondary | ICD-10-CM | POA: Insufficient documentation

## 2016-01-16 DIAGNOSIS — Z79899 Other long term (current) drug therapy: Secondary | ICD-10-CM | POA: Diagnosis not present

## 2016-01-16 DIAGNOSIS — F329 Major depressive disorder, single episode, unspecified: Secondary | ICD-10-CM | POA: Diagnosis not present

## 2016-01-16 LAB — CBC WITH DIFFERENTIAL/PLATELET
Basophils Absolute: 0 K/uL (ref 0.0–0.1)
Basophils Relative: 0 %
Eosinophils Absolute: 0.2 K/uL (ref 0.0–0.7)
Eosinophils Relative: 2 %
HCT: 29.4 % — ABNORMAL LOW (ref 36.0–46.0)
Hemoglobin: 9.5 g/dL — ABNORMAL LOW (ref 12.0–15.0)
Lymphocytes Relative: 14 %
Lymphs Abs: 1 K/uL (ref 0.7–4.0)
MCH: 29.7 pg (ref 26.0–34.0)
MCHC: 32.3 g/dL (ref 30.0–36.0)
MCV: 91.9 fL (ref 78.0–100.0)
Monocytes Absolute: 0.9 K/uL (ref 0.1–1.0)
Monocytes Relative: 12 %
Neutro Abs: 5 K/uL (ref 1.7–7.7)
Neutrophils Relative %: 72 %
Platelets: 374 K/uL (ref 150–400)
RBC: 3.2 MIL/uL — ABNORMAL LOW (ref 3.87–5.11)
RDW: 14.1 % (ref 11.5–15.5)
WBC: 7 K/uL (ref 4.0–10.5)

## 2016-01-16 LAB — COMPREHENSIVE METABOLIC PANEL WITH GFR
ALT: 13 U/L — ABNORMAL LOW (ref 14–54)
AST: 20 U/L (ref 15–41)
Albumin: 2.3 g/dL — ABNORMAL LOW (ref 3.5–5.0)
Alkaline Phosphatase: 67 U/L (ref 38–126)
Anion gap: 6 (ref 5–15)
BUN: 19 mg/dL (ref 6–20)
CO2: 30 mmol/L (ref 22–32)
Calcium: 8.4 mg/dL — ABNORMAL LOW (ref 8.9–10.3)
Chloride: 104 mmol/L (ref 101–111)
Creatinine, Ser: 0.55 mg/dL (ref 0.44–1.00)
GFR calc Af Amer: >60 mL/min
GFR calc non Af Amer: >60 mL/min
Glucose, Bld: 98 mg/dL (ref 65–99)
Potassium: 3.6 mmol/L (ref 3.5–5.1)
Sodium: 140 mmol/L (ref 135–145)
Total Bilirubin: 0.8 mg/dL (ref 0.3–1.2)
Total Protein: 5.6 g/dL — ABNORMAL LOW (ref 6.5–8.1)

## 2016-01-16 LAB — URINE MICROSCOPIC-ADD ON

## 2016-01-16 LAB — URINALYSIS, ROUTINE W REFLEX MICROSCOPIC
BILIRUBIN URINE: NEGATIVE
Glucose, UA: NEGATIVE mg/dL
Ketones, ur: NEGATIVE mg/dL
NITRITE: NEGATIVE
PH: 7.5 (ref 5.0–8.0)
Protein, ur: 30 mg/dL — AB
SPECIFIC GRAVITY, URINE: 1.018 (ref 1.005–1.030)

## 2016-01-16 MED ORDER — LORAZEPAM 1 MG PO TABS
1.0000 mg | ORAL_TABLET | Freq: Once | ORAL | Status: AC
  Administered 2016-01-16: 1 mg via ORAL
  Filled 2016-01-16: qty 1

## 2016-01-16 NOTE — ED Notes (Addendum)
Per EMS, pt from [facility].  Staff reported dark urine last night, noticed blood in her foley this am.  Pt has hx of dementia.

## 2016-01-16 NOTE — ED Notes (Signed)
Family at bedside. Daughter 

## 2016-01-16 NOTE — ED Provider Notes (Signed)
CSN: [id number]     Arrival date & time 01/16/16  1029 History   First MD Initiated Contact with Patient 01/16/16 1108     Chief Complaint  Patient presents with  . Hematuria     (Consider location/radiation/quality/duration/timing/severity/associated sxs/prior Treatment) HPI   Elizabeth Booth is a 82 year old female with a past medical history of PE on our requests, dementia who presents to the ED with complaints of hematuria. Level V caveat, dementia. Patient is unsure why she is here. On exam patient has a Foley catheter with obvious blood in catheter bag. She denies complaints of any pain.  Upon review of patient's records patient was recently admitted to the hospital discharged on 6/18 for bilateral pulmonary emboli and DVT and was placed on Eliquis. She also had a urinary tract infection and was given Levaquin for this.  Past Medical History  Diagnosis Date  . Hypertension   . History of hiatal hernia   . Pulmonary embolism, bilateral (HCC) 01/07/2016  . GERD (gastroesophageal reflux disease)   . Headache     "maybe twice/wk" (01/09/2016)  . Dementia     "in the last year" (01/09/2016)  . Depression   . Bilateral pulmonary embolism (HCC) with probable DVT resulting in knee pain 01/07/2016  . Dementia with behavioral disturbance 01/07/2016  . Pelvic floor dysfunction 01/07/2016  . Pressure ulcer of both heels, unstageable (HCC), present on admission 01/09/2016  . Protein-calorie malnutrition, severe 01/08/2016  . Urinary retention 01/07/2016   Past Surgical History  Procedure Laterality Date  . Cholecystectomy open  1953  . Appendectomy  1945  . Cataract extraction      "? side"   Family History  Problem Relation Age of Onset  . Stroke Mother   . Stroke Father    Social History  Substance Use Topics  . Smoking status: Never Smoker   . Smokeless tobacco: Never Used  . Alcohol Use: No   OB History    No data available     Review of Systems  Unable to perform ROS:  Dementia      Allergies  Penicillins  Home Medications   Prior to Admission medications   Medication Sig Start Date End Date Taking? Authorizing Provider  acetaminophen (TYLENOL) 325 MG tablet Take 2 tablets (650 mg total) by mouth every 6 (six) hours as needed for mild pain (or Fever >/= 101). 01/11/16   Christina P Rama, MD  amLODipine (NORVASC) 5 MG tablet Take 1 tablet (5 mg total) by mouth daily. 01/01/16   Kathlen ModyVijaya Akula, MD  apixaban (ELIQUIS) 5 MG TABS tablet Take 2 tablets (10 mg total) by mouth 2 (two) times daily. 01/11/16 01/23/16  Maryruth Bunhristina P Rama, MD  apixaban (ELIQUIS) 5 MG TABS tablet Take 1 tablet (5 mg total) by mouth 2 (two) times daily. 01/24/16   Christina P Rama, MD  feeding supplement, ENSURE ENLIVE, (ENSURE ENLIVE) LIQD Take 237 mLs by mouth 3 (three) times daily between meals. 01/11/16   Christina P Rama, MD  OLANZapine (ZYPREXA) 5 MG tablet Take 1 tablet (5 mg total) by mouth at bedtime. 01/11/16   Christina P Rama, MD   BP 132/54 mmHg  Pulse 82  Temp(Src) 98.5 F (36.9 C) (Oral)  Resp 18  SpO2 98% Physical Exam  Constitutional: No distress.  Ill appearing, elderly female  HENT:  Head: Normocephalic and atraumatic.  Mouth/Throat: No oropharyngeal exudate.  Eyes: Conjunctivae and EOM are normal. Pupils are equal, round, and reactive to light. Right eye  exhibits no discharge. Left eye exhibits no discharge. No scleral icterus.  Cardiovascular: Normal rate, regular rhythm, normal heart sounds and intact distal pulses.  Exam reveals no gallop and no friction rub.   No murmur heard. Pulmonary/Chest: Effort normal and breath sounds normal. No respiratory distress. She has no wheezes. She has no rales. She exhibits no tenderness.  Abdominal: She exhibits distension. She exhibits no mass. There is no tenderness. There is no rebound and no guarding.  Genitourinary:  Foley cath in place, with obvious blood in urine. NO clots seen.  Musculoskeletal: Normal range of motion.  She exhibits no edema.  Neurological: She is alert.  Pt disoriented to person, place and time, baseline for pt.  Skin: Skin is warm and dry. No rash noted. She is not diaphoretic. No erythema. No pallor.  Psychiatric: She has a normal mood and affect. Her behavior is normal.  Nursing note and vitals reviewed.   ED Course  Procedures (including critical care time) Labs Review Labs Reviewed  URINALYSIS, ROUTINE W REFLEX MICROSCOPIC (NOT AT [facility]) - Abnormal; Notable for the following:    Color, Urine RED (*)    APPearance CLOUDY (*)    Hgb urine dipstick LARGE (*)    Protein, ur 30 (*)    Leukocytes, UA SMALL (*)    All other components within normal limits  COMPREHENSIVE METABOLIC PANEL - Abnormal; Notable for the following:    Calcium 8.4 (*)    Total Protein 5.6 (*)    Albumin 2.3 (*)    ALT 13 (*)    All other components within normal limits  CBC WITH DIFFERENTIAL/PLATELET - Abnormal; Notable for the following:    RBC 3.20 (*)    Hemoglobin 9.5 (*)    HCT 29.4 (*)    All other components within normal limits  URINE MICROSCOPIC-ADD ON - Abnormal; Notable for the following:    Squamous Epithelial / LPF 0-5 (*)    Bacteria, UA RARE (*)    Crystals CA OXALATE CRYSTALS (*)    All other components within normal limits  URINE CULTURE    Imaging Review No results found. I have personally reviewed and evaluated these images and lab results as part of my medical decision-making.   EKG Interpretation None      MDM   Final diagnoses:  Hematuria   82 year old female with history of PE on L request, dementia presents to the ED with hematuria. Spoke with patient's nursing facility who state that the nurses noticed blood in her Foley catheter bag early this morning. Patient was otherwise at her baseline. She has not missed any doses of her Eliquis. Pt daughter is at bedside and states that pt appears at her baseline. There is obvious blood in the foley bag, but it appears to be  draining appropriately. No clots seen. Creatinine is within normal limits. UA does not appear to be infected. Hemoglobin is stable at 9.5. Upon review of patient's recent imaging she had a CT abdomen done last week which reveals hydronephrosis and urinary retention which was the indication for the Foley catheter that she now has in place. There is no stone seen at the time but thought there might have been an occult 1. Spoke with urology ,Dr. Retta Diones,  today regarding patient's symptoms and they feel that the blood in her urine is likely related to the Eliquis. Urologist states that as long as the catheter is draining appropriately nothing urgent needs to be done at this time. This  should likely resolve in the next few days. Will have pt follow up with PCP and urology. Discussed this treatment plan with daughter who expressed understanding. Return precautions outlined in patient discharge instructions.   Case discussed with Dr. Radford PaxBeaton who agrees with treatment plan.   Lester KinsmanSamantha Tripp EvansburgDowless, PA-C 01/16/16 1824  Nelva Nayobert Beaton, MD 01/17/16 0900

## 2016-01-16 NOTE — Discharge Instructions (Signed)
Hematuria, Adult Hematuria is blood in your urine. It can be caused by a bladder infection, kidney infection, prostate infection, kidney stone, or cancer of your urinary tract. Infections can usually be treated with medicine, and a kidney stone usually will pass through your urine. If neither of these is the cause of your hematuria, further workup to find out the reason may be needed. It is very important that you tell your health care provider about any blood you see in your urine, even if the blood stops without treatment or happens without causing pain. Blood in your urine that happens and then stops and then happens again can be a symptom of a very serious condition. Also, pain is not a symptom in the initial stages of many urinary cancers. HOME CARE INSTRUCTIONS   Drink lots of fluid, 3-4 quarts a day. If you have been diagnosed with an infection, cranberry juice is especially recommended, in addition to large amounts of water.  Avoid caffeine, tea, and carbonated beverages because they tend to irritate the bladder.  Avoid alcohol because it may irritate the prostate.  Take all medicines as directed by your health care provider.  If you were prescribed an antibiotic medicine, finish it all even if you start to feel better.  If you have been diagnosed with a kidney stone, follow your health care provider's instructions regarding straining your urine to catch the stone.  Empty your bladder often. Avoid holding urine for long periods of time.  After a bowel movement, women should cleanse front to back. Use each tissue only once.  Empty your bladder before and after sexual intercourse if you are a female. SEEK MEDICAL CARE IF:  You develop back pain.  You have a fever.  You have a feeling of sickness in your stomach (nausea) or vomiting.  Your symptoms are not better in 3 days. Return sooner if you are getting worse. SEEK IMMEDIATE MEDICAL CARE IF:   You develop severe vomiting and  are unable to keep the medicine down.  You develop severe back or abdominal pain despite taking your medicines.  You begin passing a large amount of blood or clots in your urine.  You feel extremely weak or faint, or you pass out. MAKE SURE YOU:   Understand these instructions.  Will watch your condition.  Will get help right away if you are not doing well or get worse.   This information is not intended to replace advice given to you by your health care provider. Make sure you discuss any questions you have with your health care provider.   The blood in your urine is likely related to your new blood thinning medication, Eliquis. This should resolve over the next few days. You will need to follow up with Urology for re-evaluation and also to have your blood counts re-checked. Return to the ED if you experience fevers, abdominal pain, pus in urine, or if your foley catheter stops draining.

## 2016-01-16 NOTE — ED Notes (Signed)
Bed: WHALD Expected date:  Expected time:  Means of arrival:  Comments: 

## 2016-01-16 NOTE — ED Notes (Signed)
Pt sent from [facility] d/t blood in her foley.  Pt has hx of dementia.  Unable to give information.

## 2016-01-17 LAB — URINE CULTURE: Culture: NO GROWTH

## 2016-01-18 ENCOUNTER — Observation Stay (HOSPITAL_COMMUNITY)
Admission: EM | Admit: 2016-01-18 | Discharge: 2016-01-21 | Disposition: A | Discharge status: Home / Self Care | Payer: PPO | Attending: Internal Medicine | Admitting: Internal Medicine

## 2016-01-18 ENCOUNTER — Encounter (HOSPITAL_COMMUNITY): Payer: Self-pay

## 2016-01-18 DIAGNOSIS — L89610 Pressure ulcer of right heel, unstageable: Secondary | ICD-10-CM | POA: Diagnosis present

## 2016-01-18 DIAGNOSIS — M25569 Pain in unspecified knee: Secondary | ICD-10-CM

## 2016-01-18 DIAGNOSIS — Z79899 Other long term (current) drug therapy: Secondary | ICD-10-CM | POA: Insufficient documentation

## 2016-01-18 DIAGNOSIS — F329 Major depressive disorder, single episode, unspecified: Secondary | ICD-10-CM | POA: Diagnosis not present

## 2016-01-18 DIAGNOSIS — F0391 Unspecified dementia with behavioral disturbance: Secondary | ICD-10-CM | POA: Insufficient documentation

## 2016-01-18 DIAGNOSIS — L89620 Pressure ulcer of left heel, unstageable: Secondary | ICD-10-CM

## 2016-01-18 DIAGNOSIS — D62 Acute posthemorrhagic anemia: Secondary | ICD-10-CM | POA: Insufficient documentation

## 2016-01-18 DIAGNOSIS — D500 Iron deficiency anemia secondary to blood loss (chronic): Secondary | ICD-10-CM

## 2016-01-18 DIAGNOSIS — K921 Melena: Principal | ICD-10-CM

## 2016-01-18 DIAGNOSIS — R339 Retention of urine, unspecified: Secondary | ICD-10-CM | POA: Diagnosis present

## 2016-01-18 DIAGNOSIS — Z8679 Personal history of other diseases of the circulatory system: Secondary | ICD-10-CM | POA: Diagnosis not present

## 2016-01-18 DIAGNOSIS — M6289 Other specified disorders of muscle: Secondary | ICD-10-CM | POA: Diagnosis present

## 2016-01-18 DIAGNOSIS — M25469 Effusion, unspecified knee: Secondary | ICD-10-CM | POA: Insufficient documentation

## 2016-01-18 DIAGNOSIS — Z7901 Long term (current) use of anticoagulants: Secondary | ICD-10-CM | POA: Diagnosis not present

## 2016-01-18 DIAGNOSIS — M25461 Effusion, right knee: Secondary | ICD-10-CM

## 2016-01-18 DIAGNOSIS — I10 Essential (primary) hypertension: Secondary | ICD-10-CM | POA: Diagnosis present

## 2016-01-18 DIAGNOSIS — M25561 Pain in right knee: Secondary | ICD-10-CM

## 2016-01-18 DIAGNOSIS — F03918 Unspecified dementia, unspecified severity, with other behavioral disturbance: Secondary | ICD-10-CM | POA: Diagnosis present

## 2016-01-18 DIAGNOSIS — I2699 Other pulmonary embolism without acute cor pulmonale: Secondary | ICD-10-CM | POA: Diagnosis present

## 2016-01-18 LAB — CBC
HCT: 28.3 % — ABNORMAL LOW (ref 36.0–46.0)
Hemoglobin: 9.1 g/dL — ABNORMAL LOW (ref 12.0–15.0)
MCH: 30.1 pg (ref 26.0–34.0)
MCHC: 32.2 g/dL (ref 30.0–36.0)
MCV: 93.7 fL (ref 78.0–100.0)
PLATELETS: 351 10*3/uL (ref 150–400)
RBC: 3.02 MIL/uL — AB (ref 3.87–5.11)
RDW: 14.4 % (ref 11.5–15.5)
WBC: 6.5 10*3/uL (ref 4.0–10.5)

## 2016-01-18 LAB — CBC WITH DIFFERENTIAL/PLATELET
BASOS ABS: 0 10*3/uL (ref 0.0–0.1)
BASOS PCT: 0 %
EOS ABS: 0.3 10*3/uL (ref 0.0–0.7)
Eosinophils Relative: 4 %
HEMATOCRIT: 28.1 % — AB (ref 36.0–46.0)
Hemoglobin: 8.9 g/dL — ABNORMAL LOW (ref 12.0–15.0)
Lymphocytes Relative: 16 %
Lymphs Abs: 1.1 10*3/uL (ref 0.7–4.0)
MCH: 29.9 pg (ref 26.0–34.0)
MCHC: 31.7 g/dL (ref 30.0–36.0)
MCV: 94.3 fL (ref 78.0–100.0)
MONO ABS: 0.9 10*3/uL (ref 0.1–1.0)
Monocytes Relative: 12 %
NEUTROS ABS: 4.8 10*3/uL (ref 1.7–7.7)
Neutrophils Relative %: 68 %
PLATELETS: 351 10*3/uL (ref 150–400)
RBC: 2.98 MIL/uL — ABNORMAL LOW (ref 3.87–5.11)
RDW: 14.3 % (ref 11.5–15.5)
WBC: 7 10*3/uL (ref 4.0–10.5)

## 2016-01-18 LAB — BASIC METABOLIC PANEL
ANION GAP: 6 (ref 5–15)
BUN: 16 mg/dL (ref 6–20)
CALCIUM: 8.3 mg/dL — AB (ref 8.9–10.3)
CO2: 31 mmol/L (ref 22–32)
CREATININE: 0.62 mg/dL (ref 0.44–1.00)
Chloride: 103 mmol/L (ref 101–111)
Glucose, Bld: 98 mg/dL (ref 65–99)
Potassium: 3.5 mmol/L (ref 3.5–5.1)
SODIUM: 140 mmol/L (ref 135–145)

## 2016-01-18 LAB — POC OCCULT BLOOD, ED: FECAL OCCULT BLD: POSITIVE — AB

## 2016-01-18 MED ORDER — KCL IN DEXTROSE-NACL 20-5-0.45 MEQ/L-%-% IV SOLN
INTRAVENOUS | Status: DC
  Administered 2016-01-18: 23:00:00 via INTRAVENOUS
  Administered 2016-01-20: 1000 mL via INTRAVENOUS
  Filled 2016-01-18 (×4): qty 1000

## 2016-01-18 MED ORDER — ACETAMINOPHEN 650 MG RE SUPP
650.0000 mg | Freq: Four times a day (QID) | RECTAL | Status: DC | PRN
  Filled 2016-01-18: qty 1

## 2016-01-18 MED ORDER — ACETAMINOPHEN 325 MG PO TABS
650.0000 mg | ORAL_TABLET | Freq: Four times a day (QID) | ORAL | Status: DC | PRN
  Administered 2016-01-18 – 2016-01-21 (×6): 650 mg via ORAL
  Filled 2016-01-18 (×5): qty 2

## 2016-01-18 MED ORDER — ALPRAZOLAM 0.25 MG PO TABS
0.2500 mg | ORAL_TABLET | Freq: Two times a day (BID) | ORAL | Status: DC
  Administered 2016-01-18 – 2016-01-21 (×5): 0.25 mg via ORAL
  Filled 2016-01-18 (×6): qty 1

## 2016-01-18 MED ORDER — HEPARIN (PORCINE) IN NACL 100-0.45 UNIT/ML-% IJ SOLN
800.0000 [IU]/h | INTRAMUSCULAR | Status: DC
  Administered 2016-01-18: 800 [IU]/h via INTRAVENOUS
  Filled 2016-01-18: qty 250

## 2016-01-18 MED ORDER — ONDANSETRON HCL 4 MG/2ML IJ SOLN: 4.0000 mg | Freq: Four times a day (QID) | INTRAMUSCULAR | Status: DC | PRN

## 2016-01-18 MED ORDER — HALOPERIDOL LACTATE 5 MG/ML IJ SOLN
2.0000 mg | Freq: Four times a day (QID) | INTRAMUSCULAR | Status: DC | PRN
  Administered 2016-01-19 – 2016-01-20 (×3): 2 mg via INTRAVENOUS
  Filled 2016-01-18 (×3): qty 1

## 2016-01-18 MED ORDER — OLANZAPINE 5 MG PO TABS
5.0000 mg | ORAL_TABLET | Freq: Every day | ORAL | Status: DC
  Administered 2016-01-19 – 2016-01-21 (×3): 5 mg via ORAL
  Filled 2016-01-18 (×3): qty 1

## 2016-01-18 MED ORDER — ONDANSETRON HCL 4 MG PO TABS: 4.0000 mg | ORAL_TABLET | Freq: Four times a day (QID) | ORAL | Status: DC | PRN

## 2016-01-18 NOTE — Progress Notes (Signed)
ANTICOAGULATION CONSULT NOTE - Initial Consult  Pharmacy Consult for Heparin Indication: pulmonary embolus--Bridge with apixaban  Allergies  Allergen Reactions  . Penicillins Anaphylaxis    Did PCN reaction causing immediate rash, facial/tongue/throat swelling, SOB or lightheadedness with hypotension: unknown Did PCN reaction causing severe rash involving mucus membranes or skin necrosis: unknown Did PCN reaction that required hospitalization: unknown Did PCN reaction occurring within the last 10 years: unknown If all of the above answers are "NO", then may proceed with Cephalosporin use. Pt is Unable to answer questions, and no family available to answer questions    Patient Measurements: Height: 5\' 4"  (162.6 cm) Weight: 130 lb (58.968 kg) IBW/kg (Calculated) : 54.7 Heparin Dosing Weight:   Vital Signs: Temp: 98 F (36.7 C) (06/25 2320) Temp Source: Oral (06/25 2320) BP: 131/60 mmHg (06/25 2320) Pulse Rate: 83 (06/25 2320)  Labs:  Recent Labs  01/16/16 1144 01/18/16 1937  HGB 9.5* 8.9*  HCT 29.4* 28.1*  PLT 374 351  CREATININE 0.55 0.62    Estimated Creatinine Clearance: 39.6 mL/min (by C-G formula based on Cr of 0.62).   Medical History: Past Medical History  Diagnosis Date  . Hypertension   . History of hiatal hernia   . Pulmonary embolism, bilateral (HCC) 01/07/2016  . GERD (gastroesophageal reflux disease)   . Headache     "maybe twice/wk" (01/09/2016)  . Dementia     "in the last year" (01/09/2016)  . Depression   . Bilateral pulmonary embolism (HCC) with probable DVT resulting in knee pain 01/07/2016  . Dementia with behavioral disturbance 01/07/2016  . Pelvic floor dysfunction 01/07/2016  . Pressure ulcer of both heels, unstageable (HCC), present on admission 01/09/2016  . Protein-calorie malnutrition, severe 01/08/2016  . Urinary retention 01/07/2016    Medications:  Infusions:  . dextrose 5 % and 0.45 % NaCl with KCl 20 mEq/L 75 mL/hr at 01/18/16  2326  . heparin      Assessment: Patient with hx of PE on apixaban.  Last dose noted to be 6/25 at 0900.  MD wanted low end of heparin goal due to possible GI bleed. PTT ordered with Heparin level until both correlate due to possible drug-lab interaction between oral anticoagulant (rivaroxaban, edoxaban, or apixaban) and anti-Xa level (aka heparin level)  Goal of Therapy:  Heparin level 0.3-0.7 units/ml aPTT 66-102 seconds Monitor platelets by anticoagulation protocol: Yes   Plan:  Heparin drip at 800 units/hr Heparin level/PTT at 1000   Darlina GuysGrimsley Jr, Jacquenette ShoneJulian Crowford 01/18/2016,11:33 PM

## 2016-01-18 NOTE — ED Notes (Signed)
PT RECEIVED FROM [facility] FOR BLACK STOOL X1 TODAY. PER EMS THIS IS A CHRONIC FINDING THAT THE PT HAS BEEN SEEN FOR IN THE PAST. PT STATES SHE HAD A BLACK STOOL SEVERAL DAYS AGO, BUT NONE TODAY. DENIES ABDOMINAL PAIN OR DIARRHEA.

## 2016-01-18 NOTE — H&P (Signed)
History and Physical    Elizabeth Booth ZOX:[id number] DOB: 1923-06-12 DOA: 01/18/2016  PCP: [name], MD Consultants:   Patient coming from: [facility]  Chief Complaint: dark stools  HPI: Elizabeth Booth is a 82 y.o. female with medical history significant of recent hospitalizations x 2 who was sent for evaluation after the nursing staff at [facility] noticed that she had dark stools.  The patient has advanced dementia and was unsure of she is here.  She reports she came to the hospital today "because they told me to.  Not because I want to."  She has no idea what brought her in.  She states that she feels good.  "The only thing it might have been on my calendar to come up here."  Denies dark stools.  ROS totally negative.  Uncertain when her last BM was or what it looked like.  Admitted from 6/5-01/01/16 for possible melena.  Her family declined EGD during that hospitalization, her hemoglobin stabilized and she was discharged back to home with her daughter, Elizabeth Booth. She returned on 6/14-18/17 and was admitted for bilateral PE and probable DVT.  She was started on a heparin drip and transitioned to Eliquis when her hemoglobin was proven to be stable.  She was discharged this time to SNF rehab at [facility]. She also had an ER visit for frank hematuria on 6/23; after discussion with urology, it was thought to be a result from the Eliquis and was anticipated to improve spontaneously.    ED Course: labs  Review of Systems: Unable to perform due to advanced dementia  Ambulatory Status: Primarily bedbound  Past Medical History  Diagnosis Date  . Hypertension   . History of hiatal hernia   . Pulmonary embolism, bilateral (HCC) 01/07/2016  . GERD (gastroesophageal reflux disease)   . Headache     "maybe twice/wk" (01/09/2016)  . Dementia     "in the last year" (01/09/2016)  . Depression   . Bilateral pulmonary embolism (HCC) with probable DVT resulting in knee  pain 01/07/2016  . Dementia with behavioral disturbance 01/07/2016  . Pelvic floor dysfunction 01/07/2016  . Pressure ulcer of both heels, unstageable (HCC), present on admission 01/09/2016  . Protein-calorie malnutrition, severe 01/08/2016  . Urinary retention 01/07/2016    Past Surgical History  Procedure Laterality Date  . Cholecystectomy open  1953  . Appendectomy  1945  . Cataract extraction      "? side"    Social History   Social History  . Marital Status: Widowed    Spouse Name: N/A  . Number of Children: N/A  . Years of Education: N/A   Occupational History  . Not on file.   Social History Main Topics  . Smoking status: Never Smoker   . Smokeless tobacco: Never Used  . Alcohol Use: No  . Drug Use: No  . Sexual Activity: No   Other Topics Concern  . Not on file   Social History Narrative    Allergies  Allergen Reactions  . Penicillins Anaphylaxis    Did PCN reaction causing immediate rash, facial/tongue/throat swelling, SOB or lightheadedness with hypotension: unknown Did PCN reaction causing severe rash involving mucus membranes or skin necrosis: unknown Did PCN reaction that required hospitalization: unknown Did PCN reaction occurring within the last 10 years: unknown If all of the above answers are "NO", then may proceed with Cephalosporin use. Pt is Unable to answer questions, and no family available to answer questions    Family  History  Problem Relation Age of Onset  . Stroke Mother   . Stroke Father     Prior to Admission medications   Medication Sig Start Date End Date Taking? Authorizing Provider  acetaminophen (TYLENOL) 325 MG tablet Take 2 tablets (650 mg total) by mouth every 6 (six) hours as needed for mild pain (or Fever >/= 101). 01/11/16  Yes Christina P Rama, MD  ALPRAZolam (XANAX) 0.25 MG tablet Take 0.25 mg by mouth 2 (two) times daily.   Yes Historical Provider, MD  Amino Acids-Protein Hydrolys (FEEDING SUPPLEMENT, PRO-STAT SUGAR FREE  64,) LIQD Take 30 mLs by mouth 2 (two) times daily.   Yes Historical Provider, MD  amLODipine (NORVASC) 5 MG tablet Take 1 tablet (5 mg total) by mouth daily. 01/01/16  Yes Kathlen ModyVijaya Akula, MD  apixaban (ELIQUIS) 5 MG TABS tablet Take 2 tablets (10 mg total) by mouth 2 (two) times daily. 01/11/16 01/23/16 Yes Christina P Rama, MD  feeding supplement, ENSURE ENLIVE, (ENSURE ENLIVE) LIQD Take 237 mLs by mouth 3 (three) times daily between meals. 01/11/16  Yes Maryruth Bunhristina P Rama, MD  Multiple Vitamins-Minerals (DECUBI-VITE PO) Take 1 tablet by mouth daily.   Yes Historical Provider, MD  OLANZapine (ZYPREXA) 5 MG tablet Take 1 tablet (5 mg total) by mouth at bedtime. Patient taking differently: Take 5 mg by mouth daily.  01/11/16  Yes Maryruth Bunhristina P Rama, MD    Physical Exam: Filed Vitals:   01/18/16 1902 01/18/16 1907 01/18/16 1926 01/18/16 2102  BP:   108/51 117/47  Pulse:   83 79  Temp:   98 F (36.7 C)   TempSrc:   Oral   Resp:    20  Height:  5\' 4"  (1.626 m)    Weight:  58.968 kg (130 lb)    SpO2: 93%  100% 97%     General: Appears calm and comfortable and is NAD but is oriented only to person Eyes:  PERRL, EOMI, normal lids, iris ENT:  grossly normal hearing, lips & tongue, mmm Neck:  no LAD, masses or thyromegaly Cardiovascular:  RRR, no m/r/g. No LE edema.  Respiratory:  CTA bilaterally, no w/r/r. Normal respiratory effort. Abdomen:  soft, ntnd, NABS Skin:  no rash or induration seen on limited exam Musculoskeletal: marked pitting edema of RLE but not LLE; RLE also with warmth, mild erythema but no apparent TTP Psychiatric: oriented only to person, pleasantly demented Neurologic: grossly intact but some difficulty following commands so very gross exam  Labs on Admission: I have personally reviewed following labs and imaging studies  CBC:  Recent Labs Lab 01/16/16 1144 01/18/16 1937  WBC 7.0 7.0  NEUTROABS 5.0 4.8  HGB 9.5* 8.9*  HCT 29.4* 28.1*  MCV 91.9 94.3  PLT 374 351    Basic Metabolic Panel:  Recent Labs Lab 01/16/16 1144 01/18/16 1937  NA 140 140  K 3.6 3.5  CL 104 103  CO2 30 31  GLUCOSE 98 98  BUN 19 16  CREATININE 0.55 0.62  CALCIUM 8.4* 8.3*   GFR: Estimated Creatinine Clearance: 39.6 mL/min (by C-G formula based on Cr of 0.62). Liver Function Tests:  Recent Labs Lab 01/16/16 1144  AST 20  ALT 13*  ALKPHOS 67  BILITOT 0.8  PROT 5.6*  ALBUMIN 2.3*   No results for input(s): LIPASE, AMYLASE in the last 168 hours. No results for input(s): AMMONIA in the last 168 hours. Coagulation Profile: No results for input(s): INR, PROTIME in the last 168 hours. Cardiac Enzymes:  No results for input(s): CKTOTAL, CKMB, CKMBINDEX, TROPONINI in the last 168 hours. BNP (last 3 results) No results for input(s): PROBNP in the last 8760 hours. HbA1C: No results for input(s): HGBA1C in the last 72 hours. CBG: No results for input(s): GLUCAP in the last 168 hours. Lipid Profile: No results for input(s): CHOL, HDL, LDLCALC, TRIG, CHOLHDL, LDLDIRECT in the last 72 hours. Thyroid Function Tests: No results for input(s): TSH, T4TOTAL, FREET4, T3FREE, THYROIDAB in the last 72 hours. Anemia Panel: No results for input(s): VITAMINB12, FOLATE, FERRITIN, TIBC, IRON, RETICCTPCT in the last 72 hours. Urine analysis:    Component Value Date/Time   COLORURINE RED* 01/16/2016 1133   APPEARANCEUR CLOUDY* 01/16/2016 1133   LABSPEC 1.018 01/16/2016 1133   PHURINE 7.5 01/16/2016 1133   GLUCOSEU NEGATIVE 01/16/2016 1133   HGBUR LARGE* 01/16/2016 1133   BILIRUBINUR NEGATIVE 01/16/2016 1133   KETONESUR NEGATIVE 01/16/2016 1133   PROTEINUR 30* 01/16/2016 1133   NITRITE NEGATIVE 01/16/2016 1133   LEUKOCYTESUR SMALL* 01/16/2016 1133    Creatinine Clearance: Estimated Creatinine Clearance: 39.6 mL/min (by C-G formula based on Cr of 0.62).  Sepsis Labs: (procalcitonin:4,lacticidven:4) ) Recent Results (from the past 240 hour(s))  Culture,  Urine     Status: None   Collection Time: 01/10/16 10:56 AM  Result Value Ref Range Status   Specimen Description URINE, CLEAN CATCH  Final   Special Requests NONE  Final   Culture NO GROWTH  Final   Report Status 01/11/2016 FINAL  Final  Urine culture     Status: None   Collection Time: 01/16/16 11:33 AM  Result Value Ref Range Status   Specimen Description URINE, CATHETERIZED  Final   Special Requests NONE  Final   Culture NO GROWTH Performed at [facility]   Final   Report Status 01/17/2016 FINAL  Final     Radiological Exams on Admission: No results found.  EKG:  Not done  Assessment/Plan Principal Problem:   Melena Active Problems:   Hypertension   Bilateral pulmonary embolism (HCC) with probable DVT resulting in knee pain   Urinary retention   Pelvic floor dysfunction   Dementia with behavioral disturbance   Pressure ulcer of both heels, unstageable (HCC), present on admission   Anemia due to blood loss     1. Melena in a patient on anticoagulation with NOAC - ER doctor reports dark stool with heme+ testing.  Patient previously admitted for similar concern and daughter declined EGD.  Hemoglobin today is lower than it has been during all recent visits - but she did have gross hematuria 2 days ago and so it is possible that this is equilibration from that episode.  Regardless, patient should be admitted for observation at this time and will check serial CBCs to assess if the anemia is worsening.  Due to risk of GI bleed with NOAC and relative inability to reverse, will transition again to Heparin drip.  If hemoglobin stabilizes and the patient does not have further bleeding episodes, she likely can be transitioned back to NOAC and sent back to rehab in the next 24-48 hours.  However, if she is continuing to have melanotic stools, she likely needs another family meeting for further discussion of palliative care and end-of-life treatment goals.    2. HTN - Currently  in low-normal range.  Will hold Norvasc.  3. Recent B PE - transition back to Heparin drip (pharmacy to dose) as above.  Of note, she does have RLE marked edema compared  to L.  During her last hospitalization, the documentation noted just the opposite with marked edema on the left.  DVT ultrasounds were not done during her last hospitalization and would not be expected to change management at this time.  The leg does not appear to have cellulitis at this time, but if there is ongoing concern would suggest initiation of antibiotics.  4. Urinary retention/pelvic floor dysfunction - foley is in place.  5. Dementia, advanced - Will continue Zyprexa, treat with low-dose Haldol prn agitation.   6. Pressure ulcers - evaluated during last hospitalization.  Will order wound care by nursing staff for now.   DVT prophylaxis: Heparin drip Code Status: DNR as per last hospitalization Family Communication: attempted to reach daughter, unable  Disposition Plan: likely back to rehab vs inpatient hospice unit Consults called: none  Admission status: observation   Jonah BlueJennifer Teana Lindahl MD [facility]  If 7PM-7AM, please contact night-coverage [email or web address] Password [email or web address]  01/18/2016, 10:02 PM

## 2016-01-18 NOTE — ED Notes (Signed)
Bed: AV40WA10 Expected date:  Expected time:  Means of arrival:  Comments: 82 yo dark, tarry stools

## 2016-01-19 DIAGNOSIS — Z7901 Long term (current) use of anticoagulants: Secondary | ICD-10-CM

## 2016-01-19 DIAGNOSIS — L89620 Pressure ulcer of left heel, unstageable: Secondary | ICD-10-CM

## 2016-01-19 DIAGNOSIS — F0391 Unspecified dementia with behavioral disturbance: Secondary | ICD-10-CM

## 2016-01-19 DIAGNOSIS — I2699 Other pulmonary embolism without acute cor pulmonale: Secondary | ICD-10-CM | POA: Diagnosis not present

## 2016-01-19 DIAGNOSIS — R339 Retention of urine, unspecified: Secondary | ICD-10-CM

## 2016-01-19 DIAGNOSIS — D500 Iron deficiency anemia secondary to blood loss (chronic): Secondary | ICD-10-CM | POA: Diagnosis not present

## 2016-01-19 DIAGNOSIS — L89610 Pressure ulcer of right heel, unstageable: Secondary | ICD-10-CM

## 2016-01-19 DIAGNOSIS — K921 Melena: Secondary | ICD-10-CM | POA: Diagnosis not present

## 2016-01-19 DIAGNOSIS — I10 Essential (primary) hypertension: Secondary | ICD-10-CM

## 2016-01-19 LAB — BASIC METABOLIC PANEL
ANION GAP: 6 (ref 5–15)
BUN: 16 mg/dL (ref 6–20)
CO2: 30 mmol/L (ref 22–32)
Calcium: 8 mg/dL — ABNORMAL LOW (ref 8.9–10.3)
Chloride: 105 mmol/L (ref 101–111)
Creatinine, Ser: 0.56 mg/dL (ref 0.44–1.00)
Glucose, Bld: 126 mg/dL — ABNORMAL HIGH (ref 65–99)
POTASSIUM: 3.3 mmol/L — AB (ref 3.5–5.1)
SODIUM: 141 mmol/L (ref 135–145)

## 2016-01-19 LAB — CBC
HCT: 26.4 % — ABNORMAL LOW (ref 36.0–46.0)
HCT: 24.4 % — ABNORMAL LOW (ref 36.0–46.0)
HCT: 25.4 % — ABNORMAL LOW (ref 36.0–46.0)
HCT: 24.3 % — ABNORMAL LOW (ref 36.0–46.0)
HEMOGLOBIN: 8 g/dL — AB (ref 12.0–15.0)
Hemoglobin: 8.5 g/dL — ABNORMAL LOW (ref 12.0–15.0)
Hemoglobin: 7.9 g/dL — ABNORMAL LOW (ref 12.0–15.0)
Hemoglobin: 7.7 g/dL — ABNORMAL LOW (ref 12.0–15.0)
MCH: 30.4 pg (ref 26.0–34.0)
MCH: 29.9 pg (ref 26.0–34.0)
MCH: 29.7 pg (ref 26.0–34.0)
MCH: 30 pg (ref 26.0–34.0)
MCHC: 32.2 g/dL (ref 30.0–36.0)
MCHC: 32.4 g/dL (ref 30.0–36.0)
MCHC: 31.5 g/dL (ref 30.0–36.0)
MCHC: 31.7 g/dL (ref 30.0–36.0)
MCV: 94.3 fL (ref 78.0–100.0)
MCV: 92.4 fL (ref 78.0–100.0)
MCV: 94.4 fL (ref 78.0–100.0)
MCV: 94.6 fL (ref 78.0–100.0)
PLATELETS: 319 10*3/uL (ref 150–400)
PLATELETS: 309 10*3/uL (ref 150–400)
PLATELETS: 303 10*3/uL (ref 150–400)
Platelets: 329 10*3/uL (ref 150–400)
RBC: 2.8 MIL/uL — ABNORMAL LOW (ref 3.87–5.11)
RBC: 2.64 MIL/uL — AB (ref 3.87–5.11)
RBC: 2.69 MIL/uL — ABNORMAL LOW (ref 3.87–5.11)
RBC: 2.57 MIL/uL — AB (ref 3.87–5.11)
RDW: 14.4 % (ref 11.5–15.5)
RDW: 14.2 % (ref 11.5–15.5)
RDW: 14.4 % (ref 11.5–15.5)
RDW: 14.3 % (ref 11.5–15.5)
WBC: 6.5 10*3/uL (ref 4.0–10.5)
WBC: 6.2 10*3/uL (ref 4.0–10.5)
WBC: 4.9 10*3/uL (ref 4.0–10.5)
WBC: 4.8 10*3/uL (ref 4.0–10.5)

## 2016-01-19 LAB — MRSA PCR SCREENING: MRSA by PCR: NEGATIVE

## 2016-01-19 LAB — PREPARE RBC (CROSSMATCH)

## 2016-01-19 LAB — ABO/RH: ABO/RH(D): O POS

## 2016-01-19 MED ORDER — LORAZEPAM 2 MG/ML IJ SOLN
0.5000 mg | Freq: Once | INTRAMUSCULAR | Status: AC
  Administered 2016-01-20: 0.5 mg via INTRAVENOUS
  Filled 2016-01-19: qty 1

## 2016-01-19 MED ORDER — SODIUM CHLORIDE 0.9 % IV SOLN: Freq: Once | INTRAVENOUS | Status: DC

## 2016-01-19 MED ORDER — PRO-STAT SUGAR FREE PO LIQD
30.0000 mL | Freq: Three times a day (TID) | ORAL | Status: DC
  Administered 2016-01-19 – 2016-01-21 (×4): 30 mL via ORAL
  Filled 2016-01-19 (×4): qty 30

## 2016-01-19 NOTE — Progress Notes (Signed)
Pt became confused and had incont stool; had hands in stool and smeared it everywhere. Also pulled out IV. Paged IV team for new access; pt really needed 2 sites for heparin drip and fluids/IV meds, but IV RN only able to get one 24g after 3 attempts. IV haldol given then heparin drip and IVF restarted. Mittens placed to prevent pulling of tubes. Will consider safety sitter if mittens and haldol ineffective. Continue to monitor. Mick SellShannon Elvis Boot RN

## 2016-01-19 NOTE — Progress Notes (Signed)
Pt admitted to 1334 from ED, no family/visitors present. Alert to self, place. On admission, found to be incont of large amount of black tarry stool. Foley catheter in place, cleansed tubing of stool. Pt has stage I injury to sacrum and unstageable wounds to bil heels, covered with eschar. Foam dressings applied to heels and floated on pillows. Heparin infusion initiated per order. Continue to monitor. Mick SellShannon Perla Echavarria RN

## 2016-01-19 NOTE — Progress Notes (Signed)
Initial Nutrition Assessment  DOCUMENTATION CODES:   Severe malnutrition in context of chronic illness  INTERVENTION:   Provide Prostat liquid protein PO 30 ml TID with meals, each supplement provides 100 kcal, 15 grams protein. Encourage PO intake RD to continue to monitor  NUTRITION DIAGNOSIS:   Malnutrition related to chronic illness as evidenced by severe depletion of body fat, severe depletion of muscle mass.  GOAL:   Patient will meet greater than or equal to 90% of their needs  MONITOR:   PO intake, Supplement acceptance, Labs, Weight trends, I & O's  REASON FOR ASSESSMENT:   Low Braden    ASSESSMENT:   82 y.o. female with medical history significant of recent hospitalizations x 2 who was sent for evaluation [facility] that she had dark stools. The patient has advanced dementia and was unsure of she is here. She reports she came to the hospital today "because they told me to. Not because I want to." She has no idea what brought her in. She states that she feels good. "The only thing it might have been on my calendar to come up here." Denies dark stools. ROS totally negative. Uncertain when her last BM was or what it looked like.  Pt with advanced dementia, unable to provide much history. RD spoke with RN who states that pt was really wanting some food and water but once her diet was advanced to soft diet, she was only sipping on juice. Per H&P, pt was receiving Prostat BID at facility [facility]. RD to order Prostat TID. Per RN, pt is taking her medications.  Pt's weight has remained stable per weight history. Pt with severe fat and muscle depletion.  Medications: D5 and .45% NaCl w/ KCl infusion @ 75 ml/hr- provides 306 kcal Labs reviewed: Low K  Diet Order:  DIET SOFT Room service appropriate?: Yes; Fluid consistency:: Thin  Skin:  Wound (see comment) (Unstg L & R heel ulcers, Stg 1 buttocks ulcer)  Last BM:  6/26  Height:    Ht Readings from Last 1 Encounters:  01/18/16 5\' 4"  (1.626 m)    Weight:   Wt Readings from Last 1 Encounters:  01/18/16 130 lb (58.968 kg)    Ideal Body Weight:  54.5 kg  BMI:  Body mass index is 22.3 kg/(m^2).  Estimated Nutritional Needs:   Kcal:  1200-1400  Protein:  70-80g  Fluid:  1.5L/day  EDUCATION NEEDS:   No education needs identified at this time  Tilda FrancoLindsey Killian Schwer, MS, RD, LDN Pager: [phone] After Hours Pager: [phone]

## 2016-01-19 NOTE — Progress Notes (Signed)
CSW spoke with pt's daughter, Jeronimo NormaJeanie, and daughters friend, Darl PikesSusan, at bedside about discharge plans. Pt's daughter would like for her to return to [facility] once ready for discharge and will be [facility] to discuss holding a bed. Pt's mother does not currently have a phone and left her friend, Darl PikesSusan, number [phone]) and nephew, Brett CanalesSteve, [phone]). CSW will continue to update.   Stacy GardnerErin Sylis Ketchum, LCSWA Clinical Social Worker [phone]

## 2016-01-19 NOTE — Care Management Obs Status (Signed)
MEDICARE OBSERVATION STATUS NOTIFICATION   Patient Details  Name: Elizabeth Booth MRN: [id number] Date of Birth: 10/11/1922   Medicare Observation Status Notification Given:  Yes    Bartholome BillCLEMENTS, Reilley Latorre H, RN 01/19/2016, 2:05 PM

## 2016-01-19 NOTE — Progress Notes (Signed)
ANTICOAGULATION CONSULT NOTE - Follow Up Consult  Pharmacy Consult for Heparin Indication: pulmonary embolus, apixaban bridge  Allergies  Allergen Reactions  . Penicillins Anaphylaxis    Did PCN reaction causing immediate rash, facial/tongue/throat swelling, SOB or lightheadedness with hypotension: unknown Did PCN reaction causing severe rash involving mucus membranes or skin necrosis: unknown Did PCN reaction that required hospitalization: unknown Did PCN reaction occurring within the last 10 years: unknown If all of the above answers are "NO", then may proceed with Cephalosporin use. Pt is Unable to answer questions, and no family available to answer questions    Patient Measurements: Height: 5\' 4"  (162.6 cm) Weight: 130 lb (58.968 kg) IBW/kg (Calculated) : 54.7 Heparin Dosing Weight:   Vital Signs: Temp: 97.2 F (36.2 C) (06/26 0604) Temp Source: Oral (06/26 0604) BP: 147/47 mmHg (06/26 0604) Pulse Rate: 74 (06/26 0604)  Labs:  Recent Labs  01/16/16 1144 01/18/16 1937 01/18/16 2320 01/19/16 0512  HGB 9.5* 8.9* 9.1* 8.5*  HCT 29.4* 28.1* 28.3* 26.4*  PLT 374 351 351 319  CREATININE 0.55 0.62  --  0.56    Estimated Creatinine Clearance: 39.6 mL/min (by C-G formula based on Cr of 0.56).   Medications:  Infusions:  . dextrose 5 % and 0.45 % NaCl with KCl 20 mEq/L 75 mL/hr at 01/18/16 2326    Assessment: RN called to inform about patient and new/more bleeding from rectum.  RN informed MD who ordered drip stopped.  I d/c order in EPIC.  Goal of Therapy:  Heparin level 0.3-0.7 units/ml Monitor platelets by anticoagulation protocol: Yes   Plan:  Follow up with plan if heparin to resume. D/c'd ordered heparin level  Darlina GuysGrimsley Jr, Jacquenette ShoneJulian Crowford 01/19/2016,6:12 AM

## 2016-01-19 NOTE — Progress Notes (Signed)
At this time, rectal bleeding has slowed down and pt only has a small amount of blood accumulated in the past hour. Continue to monitor. Mick SellShannon Tykesha Konicki RN

## 2016-01-19 NOTE — Progress Notes (Signed)
At 0530, RN and NT went to clean pt of incont stool. RN noted that the stool has transitioned from black and tarry earlier in the shift, to bright red blood actively and constantly oozing from the rectum. VS stable at this time. Paged on call NP and talked to her about the situation, she advised to stop the heparin gtt, notify pharmacy, and notify the family to come in and talk with the MD this AM. Pharmacist informed of the bleeding and will discontinue the order for the heparin gtt. I attempted to call the family member listed in EPIC, but did not get an answer and did not get a voicemail prompt to leave a message. Will attempt to call family again shortly. Continue to monitor. Mick SellShannon Latese Dufault RN

## 2016-01-19 NOTE — Progress Notes (Addendum)
PROGRESS NOTE    Elizabeth HabermannHelen S Dzik  ZOX:[id number] DOB: 06/19/1923 DOA: 01/18/2016 PCP: Lillia MountainGRIFFIN,JOHN JOSEPH, MD    Brief Narrative:  HPI by Dr. Ophelia CharterYates on 01/18/16  [age] y.o. female with medical history significant of recent hospitalizations x 2 who was sent for evaluation [facility] that she had dark stools. The patient has advanced dementia and was unsure of she is here. She reports she came to the hospital today "because they told me to. Not because I want to." She has no idea what brought her in. She states that she feels good. "The only thing it might have been on my calendar to come up here." Denies dark stools. ROS totally negative. Uncertain when her last BM was or what it looked like.  Admitted from 6/5-01/01/16 for possible melena. Her family declined EGD during that hospitalization, her hemoglobin stabilized and she was discharged back to home with her daughter, Jeronimo NormaJeanie. She returned on 6/14-18/17 and was admitted for bilateral PE and probable DVT. She was started on a heparin drip and transitioned to Eliquis when her hemoglobin was proven to be stable. She was [facility]. She also had an ER visit for frank hematuria on 6/23; after discussion with urology, it was thought to be a result from the Eliquis and was anticipated to improve spontaneously.  Interim History:  Nursing notes reviewed; continued to have dark stools which transitioned to bright red blood per rectum overnight of 01/18/16.  She continued to be confused.  However, Hgb has remained stable as well as her vital signs.    Assessment & Plan:   Melena - GIB, source unclear - Unclear source.  On discussion with daughter, last colonoscopy was "years" ago and she does not know any results.  Feasibly this could be from a slow upper GIB given that it started with melena, or a slow lower GIB due to her being on anticoagulation.   - PPI BID - she has no  symptoms of GI upset, but also has severe dementia - Type and screen - Trend CBC q 6 hours, transfuse for Hgb < 7, confirmed with daughter - No GI consult at this time, hopeful to avoid procedures.  If she continues to bleed throughout today, will consult GI.  - Hold anticoagulation.   - IVF with D51/2NS at 75cc/hr  Anemia due to blood loss - See above, trend CBC - Holding anticoagulation in the short term, likely long term as well.      Hypertension - BP improving today, has been stable, but low normal since admission; now 147/47 - Hold home amlodipine in the setting of acute bleeding    Bilateral pulmonary embolism (HCC) with probable DVT resulting in knee pain - Holding anticoagulation - Reviewed CT angio of the chest from 6/14 - showed multiple pulmonary emboli - She is not tachycardia, not hypoxic.  Monitor off of anticoagulation - When family is present (likely tomorrow), will need to have discussion re: palliative care, which the daughter was interested in, but wanted to proceed with acute care at this time.     Urinary retention and Pelvic floor dysfunction - Foley in place.  Monitor for change in symptoms or purulence to urine.     Dementia with behavioral disturbance - Continue home zyprexa - Frequent reminders - Light during day, dark at night - Haldol PRN for increased disturbances    Pressure ulcer of both heels, unstageable (HCC), present on admission -  Boots in place.  Also with an unstageable place on sacrum based on review of nursing notes - Nurse wound care, monitor.    Hypokalemia - mild - On IVF with K in the bag   DVT prophylaxis: SCDs given acute bleeding Code Status: Partial, DNI - confirmed on phone with daughter Family Communication: Daughter on phone Disposition Plan: Pending improvement   Consultants:   None  Procedures:   None  Antimicrobials:  None    Subjective: Unable to assess.  Nursing team was cleaning up a small melenic  stool when seen.  Reported no breakdown in bottom, but was difficult to tell.  Review of chart showed a MOST form, signed 01/13/16, which noted a trial of CPR was wanted but no intubation.  Will make her DNI at this time instead of DNR.    Did speak with her daughter and explained the course of the disease.  I told her that further anticoagulation would likely mean further bleeding (continued to bleed on heparin, but has somewhat slowed down off of it) and discussed her code wishes. She is not quite certain about DNR, and does want [place] to give medicines if she needs them.  Have left code status as DNI/partial code for now.  Also discussed procedures such as scoping, will attempt to avoid any procedures.  Hopefully with discontinuation of the anticoagulation, she will not bleed further.   Objective: Filed Vitals:   01/18/16 2130 01/18/16 2200 01/18/16 2320 01/19/16 0604  BP: 118/88 107/47 131/60 147/47  Pulse: 83 85 83 74  Temp:   98 F (36.7 C) 97.2 F (36.2 C)  TempSrc:   Oral Oral  Resp:   20 24  Height:      Weight:      SpO2: 98% 96% 100% 100%   No intake or output data in the 24 hours ending 01/19/16 0721 Filed Weights   01/18/16 1907  Weight: 130 lb (58.968 kg)    Examination:  General exam: Patient is alert, not oriented, very hard of hearing.  Mumbling, not answering questions HENT: She has no blood in her mouth, poor dentition, somewhat dry MM Respiratory system: Clear to auscultation anteriorly. Tachypneic Cardiovascular system: S1 & S2 heard, RR, NR. pedal edema. Gastrointestinal system: Abdomen is nondistended, soft and nontender. Normal bowel sounds heard. Central nervous system: Alert, not oriented given dementia. No focal neurological deficits. Skin: No rashes, lesions or ulcers on extremities.  Some bruising on arms Psychiatry: Severe dementia.  She appears anxious.     Data Reviewed:  CBC:  Recent Labs Lab 01/16/16 1144 01/18/16 1937 01/18/16 2320  01/19/16 0512  WBC 7.0 7.0 6.5 6.5  NEUTROABS 5.0 4.8  --   --   HGB 9.5* 8.9* 9.1* 8.5*  HCT 29.4* 28.1* 28.3* 26.4*  MCV 91.9 94.3 93.7 94.3  PLT 374 351 351 319   Basic Metabolic Panel:  Recent Labs Lab 01/16/16 1144 01/18/16 1937 01/19/16 0512  NA 140 140 141  K 3.6 3.5 3.3*  CL 104 103 105  CO2 GLUCOSE 98 98 126*  BUN CREATININE 0.55 0.62 0.56  CALCIUM 8.4* 8.3* 8.0*   GFR: Estimated Creatinine Clearance: 39.6 mL/min (by C-G formula based on Cr of 0.56). Liver Function Tests:  Recent Labs Lab 01/16/16 1144  AST 20  ALT 13*  ALKPHOS 67  BILITOT 0.8  PROT 5.6*  ALBUMIN 2.3*    Recent Results (from the past 240 hour(s))  Culture,  Urine     Status: None   Collection Time: 01/10/16 10:56 AM  Result Value Ref Range Status   Specimen Description URINE, CLEAN CATCH  Final   Special Requests NONE  Final   Culture NO GROWTH  Final   Report Status 01/11/2016 FINAL  Final  Urine culture     Status: None   Collection Time: 01/16/16 11:33 AM  Result Value Ref Range Status   Specimen Description URINE, CATHETERIZED  Final   Special Requests NONE  Final   Culture NO GROWTH [facility]   Final   Report Status 01/17/2016 FINAL  Final  MRSA PCR Screening     Status: None   Collection Time: 01/18/16 11:55 PM  Result Value Ref Range Status   MRSA by PCR NEGATIVE NEGATIVE Final    Comment:        The GeneXpert MRSA Assay (FDA approved for NASAL specimens only), is one component of a comprehensive MRSA colonization surveillance program. It is not intended to diagnose MRSA infection nor to guide or monitor treatment for MRSA infections.       Radiology Studies: No results found.    Scheduled Meds: . ALPRAZolam  0.25 mg Oral BID  . OLANZapine  5 mg Oral Daily   Continuous Infusions: . dextrose 5 % and 0.45 % NaCl with KCl 20 mEq/L 75 mL/hr at 01/18/16 2326        Time spent: 30 minutes    Debe CoderMULLEN,  Andreana Klingerman, MD [facility] Pager [phone]  If 7PM-7AM, please contact night-coverage [email or web address] Password [email or web address] 01/19/2016, 7:21 AM

## 2016-01-20 DIAGNOSIS — N8184 Pelvic muscle wasting: Secondary | ICD-10-CM

## 2016-01-20 DIAGNOSIS — I2699 Other pulmonary embolism without acute cor pulmonale: Secondary | ICD-10-CM | POA: Diagnosis not present

## 2016-01-20 DIAGNOSIS — Z7901 Long term (current) use of anticoagulants: Secondary | ICD-10-CM | POA: Diagnosis not present

## 2016-01-20 DIAGNOSIS — D500 Iron deficiency anemia secondary to blood loss (chronic): Secondary | ICD-10-CM | POA: Diagnosis not present

## 2016-01-20 DIAGNOSIS — K921 Melena: Secondary | ICD-10-CM | POA: Diagnosis not present

## 2016-01-20 LAB — BASIC METABOLIC PANEL
ANION GAP: 4 — AB (ref 5–15)
BUN: 15 mg/dL (ref 6–20)
CHLORIDE: 106 mmol/L (ref 101–111)
CO2: 29 mmol/L (ref 22–32)
Calcium: 8.1 mg/dL — ABNORMAL LOW (ref 8.9–10.3)
Creatinine, Ser: 0.44 mg/dL (ref 0.44–1.00)
GFR calc non Af Amer: >60 mL/min (ref >60)
Glucose, Bld: 112 mg/dL — ABNORMAL HIGH (ref 65–99)
POTASSIUM: 3.5 mmol/L (ref 3.5–5.1)
SODIUM: 139 mmol/L (ref 135–145)

## 2016-01-20 LAB — CBC
HCT: 25.9 % — ABNORMAL LOW (ref 36.0–46.0)
HEMATOCRIT: 27.2 % — AB (ref 36.0–46.0)
HEMOGLOBIN: 8.3 g/dL — AB (ref 12.0–15.0)
Hemoglobin: 8.5 g/dL — ABNORMAL LOW (ref 12.0–15.0)
MCH: 30.2 pg (ref 26.0–34.0)
MCH: 29.4 pg (ref 26.0–34.0)
MCHC: 32 g/dL (ref 30.0–36.0)
MCHC: 31.3 g/dL (ref 30.0–36.0)
MCV: 94.2 fL (ref 78.0–100.0)
MCV: 94.1 fL (ref 78.0–100.0)
Platelets: 354 10*3/uL (ref 150–400)
Platelets: 377 10*3/uL (ref 150–400)
RBC: 2.75 MIL/uL — AB (ref 3.87–5.11)
RBC: 2.89 MIL/uL — AB (ref 3.87–5.11)
RDW: 14.4 % (ref 11.5–15.5)
RDW: 14.5 % (ref 11.5–15.5)
WBC: 5.1 10*3/uL (ref 4.0–10.5)
WBC: 5.9 10*3/uL (ref 4.0–10.5)

## 2016-01-20 NOTE — Progress Notes (Signed)
Patient had one soft,big BM earlier, no bleeding.

## 2016-01-20 NOTE — Progress Notes (Signed)
PROGRESS NOTE    Elizabeth Booth  ZOX:[id number] DOB: Dec 10, 1922 DOA: 01/18/2016 PCP: [name], MD    Brief Narrative:  82 y.o. female with medical history significant advanced dementia sent to hospital for dark stools.   -Admitted from 6/5-01/01/16 for possible melena. Her family declined EGD during that hospitalization, her hemoglobin stabilized and she was discharged back to home with her daughter, [name]. -She returned on 6/14-18/17 and was admitted for bilateral PE and probable DVT. She was started on a heparin drip and transitioned to Eliquis when her hemoglobin was proven to be stable. She was discharged this time to SNF rehab at [facility]. -Now readmitted for melena.  GI not consulted so far.  Hgb down to 8 from 12 before first admission.  From admission H&P: "The patient has advanced dementia and was unsure of she is here. She reports she came to the hospital today "because they told me to. Not because I want to." She has no idea what brought her in. She states that she feels good. "The only thing it might have been on my calendar to come up here." Denies dark stools. ROS totally negative. Uncertain when her last BM was or what it looked like."    Assessment & Plan Melena - GIB, source unclear Unclear source.  Previously daughter told attending MD last colonoscopy was "years" ago and she does not know any results.  Feasibly this could be from a slow upper GIB given that it started with melena, or a slow lower GIB due to her being on anticoagulation.  Bleeding seems to have slowed clinically. - Continue PPI BID - she has no symptoms of GI upset, but also has severe dementia - Space CBC to q 12 hours, transfuse for Hgb < 7, confirmed with daughter - No GI consult at this time, hopeful to avoid procedures.  If bleeding were to restart briskly, will consult GI.  - Hold anticoagulation.   - Taking diet, will stop IVF today 01/20/2016   Anemia due to blood loss -  See above, trend CBC - Holding anticoagulation in the short term, likely long term as well.      Hypertension - [place]mlo held given soft BP, restart when able    Bilateral pulmonary embolism (HCC) with probable DVT resulting in knee pain Asymptomatic - Monitor off of anticoagulation - Reviewed CT angio of the chest from 6/14 - showed multiple pulmonary emboli including in RIGHT main pulm artery - When family is present, will need to have discussion re: palliative care, which the daughter was interested in, but wanted to proceed with acute care at this time.     Urinary retention and Pelvic floor dysfunction - Foley in place.  Monitor for change in symptoms or purulence to urine.     Dementia with behavioral disturbance - Continue home zyprexa - Frequent reminders - Light during day, dark at night - Haldol PRN for increased disturbances    Pressure ulcer of both heels, unstageable (HCC), present on admission - Boots in place.  Also with an unstageable place on sacrum based on review of nursing notes - Nurse wound care, monitor.    Hypokalemia - mild Improved   DVT prophylaxis: SCDs given acute bleeding Code Status: Partial, DNI  Family Communication:  None present Disposition Plan: Pending improvement and palliative care discussion   Consultants:   None  Procedures:   None  Antimicrobials:  None    Subjective: Patient rousable and comfortable.  She is unaware where she  is other than "[place]" nor where she normally lives.  Knows that she is [age] years old.  Not sure why she is here.  Currently comfortable, sleeping, no chest pain or dyspnea or abdominal pain.  Per nursing, one tarry stool after midnight, followed by two soft brown ones since.    Objective: Filed Vitals:   01/19/16 1333 01/19/16 1656 01/19/16 2114 01/20/16 0417  BP: 120/48 136/58 125/51 155/64  Pulse: 77 76 76 91  Temp: 98.1 F (36.7 C) 98.1 F (36.7 C) 97.5 F (36.4 C) 97.7 F (36.5 C)    TempSrc: Oral Oral Oral Axillary  Resp: 14 20 20 22   Height:      Weight:      SpO2: 98% 100% 98% 96%    Intake/Output Summary (Last 24 hours) at 01/20/16 0940 Last data filed at 01/20/16 0428  Gross per 24 hour  Intake    380 ml  Output    800 ml  Net   -420 ml   Filed Weights   01/18/16 1907  Weight: 58.968 kg (130 lb)    Examination:  General exam: Patient is sleeping but rousable and alert, not oriented, seomewhat hard of hearing.  Frail elderly. HENT: Poor dentition, dry MM Respiratory system: Clear to auscultation anteriorly. Cardiovascular system: S1 & S2 heard, RR, NR. No murmurs. Gastrointestinal system: Abdomen is nondistended, soft and nontender. Normal bowel sounds heard. Central nervous system: Alert, not oriented given dementia. No focal neurological deficits. Skin: No rashes, lesions on extremities.  Some bruising on arms.  Pressure ulcer heel. Psychiatry: Severe dementia.  Appears calm.    Data Reviewed:  CBC:  Recent Labs Lab 01/16/16 1144 01/18/16 1937  01/19/16 0512 01/19/16 1116 01/19/16 1732 01/19/16 2245 01/20/16 0459  WBC 7.0 7.0  < > 6.5 6.2 4.9 4.8 5.1  NEUTROABS 5.0 4.8  --   --   --   --   --   --   HGB 9.5* 8.9*  < > 8.5* 7.9* 8.0* 7.7* 8.3*  HCT 29.4* 28.1*  < > 26.4* 24.4* 25.4* 24.3* 25.9*  MCV 91.9 94.3  < > 94.3 92.4 94.4 94.6 94.2  PLT 374 351  < > 319 309 329 303 354  < > = values in this interval not displayed. Basic Metabolic Panel:  Recent Labs Lab 01/16/16 1144 01/18/16 1937 01/19/16 0512 01/20/16 0459  NA 140 140 141 139  K 3.6 3.5 3.3* 3.5  CL 104 103 105 106  CO2 30 31 30 29   GLUCOSE 98 98 126* 112*  BUN 19 16 16 15   CREATININE 0.55 0.62 0.56 0.44  CALCIUM 8.4* 8.3* 8.0* 8.1*   GFR: Estimated Creatinine Clearance: 39.6 mL/min (by C-G formula based on Cr of 0.44). Liver Function Tests:  Recent Labs Lab 01/16/16 1144  AST 20  ALT 13*  ALKPHOS 67  BILITOT 0.8  PROT 5.6*  ALBUMIN 2.3*    Recent  Results (from the past 240 hour(s))  Culture, Urine     Status: None   Collection Time: 01/10/16 10:56 AM  Result Value Ref Range Status   Specimen Description URINE, CLEAN CATCH  Final   Special Requests NONE  Final   Culture NO GROWTH  Final   Report Status 01/11/2016 FINAL  Final  Urine culture     Status: None   Collection Time: 01/16/16 11:33 AM  Result Value Ref Range Status   Specimen Description URINE, CATHETERIZED  Final   Special Requests NONE  Final  Culture NO GROWTH [facility]   Final   Report Status 01/17/2016 FINAL  Final  MRSA PCR Screening     Status: None   Collection Time: 01/18/16 11:55 PM  Result Value Ref Range Status   MRSA by PCR NEGATIVE NEGATIVE Final    Comment:        The GeneXpert MRSA Assay (FDA approved for NASAL specimens only), is one component of a comprehensive MRSA colonization surveillance program. It is not intended to diagnose MRSA infection nor to guide or monitor treatment for MRSA infections.       Radiology Studies: No results found.    Scheduled Meds: . sodium chloride   Intravenous Once  . ALPRAZolam  0.25 mg Oral BID  . feeding supplement (PRO-STAT SUGAR FREE 64)  30 mL Oral TID  . OLANZapine  5 mg Oral Daily   Continuous Infusions:        Time spent: 30 minutes    Alberteen Samhristopher P Tomiko Schoon, MD [facility] Pager [phone]  If 7PM-7AM, please contact night-coverage [email or web address] Password [email or web address] 01/20/2016, 9:40 AM

## 2016-01-20 NOTE — Evaluation (Signed)
Physical Therapy Evaluation Patient Details Name: Bridgette HabermannHelen S Wickliff MRN: [id number] DOB: 02/06/1923 Today's Date: 01/20/2016   History of Present Illness  82 y.o. female with medical history significant of hypertension and severe dementia. Of note patient was discharged from hospital on 12/29/2015 after admission for possible GI bleed, hypotension and mechanical fall; CT chest on 01/07/16 indicated bilateral pulmonary emboli.Admitted 01/18/16 for positive blood in stool.  Clinical Impression  The patient is confused, unable to mobilize except with 2 person assist. The patient has painful  Heels.  The patient is limited in  Ability to participate in therapies. Pt admitted with above diagnosis. Pt currently with functional limitations due to the deficits listed below (see PT Problem List).  Pt will benefit from skilled PT to increase their independence and safety with mobility to allow discharge to the venue listed below.  . The patient comes from SNF.     Follow Up Recommendations SNF;Supervision/Assistance - 24 hour    Equipment Recommendations  None recommended by PT    Recommendations for Other Services       Precautions / Restrictions Precautions Precautions: Fall Precaution Comments: incontinent      Mobility  Bed Mobility Overal bed mobility: Needs Assistance Bed Mobility: Rolling;Supine to Sit;Sit to Supine     Supine to sit: Max assist;+2 for physical assistance Sit to supine: Total assist;+2 for physical assistance   General bed mobility comments: Pt requires assist for all aspects   Transfers Overall transfer level: Needs assistance Equipment used: Rolling walker (2 wheeled);1 person hand held assist Transfers: Sit to/from Stand Sit to Stand: Total assist;+2 physical assistance Stand pivot transfers: Max assist;+2 physical assistance       General transfer comment: stood, retropulsive.  Ambulation/Gait                Stairs            Wheelchair  Mobility    Modified Rankin (Stroke Patients Only)       Balance Overall balance assessment: Needs assistance   Sitting balance-Leahy Scale: Poor       Standing balance-Leahy Scale: Zero                               Pertinent Vitals/Pain Faces Pain Scale: Hurts whole lot Pain Location: bil legs and heels, periarea Pain Descriptors / Indicators: Discomfort;Crying;Grimacing;Guarding Pain Intervention(s): Limited activity within patient's tolerance;Monitored during session;Repositioned    Home Living Family/patient expects to be discharged to:: Skilled nursing facility                      Prior Function           Comments: uncertain as to mobility level at [facility]     Hand Dominance        Extremity/Trunk Assessment   Upper Extremity Assessment: Generalized weakness           Lower Extremity Assessment: Generalized weakness      Cervical / Trunk Assessment: Kyphotic  Communication   Communication: HOH  Cognition Arousal/Alertness: Lethargic (falling asleep and snoring and periods of apnea.) Behavior During Therapy: Anxious Overall Cognitive Status: History of cognitive impairments - at baseline Area of Impairment: Following commands;Awareness   Current Attention Level: Selective   Following Commands: Follows one step commands inconsistently     Problem Solving: Slow processing      General Comments      Exercises  Assessment/Plan    PT Assessment Patient needs continued PT services  PT Diagnosis Generalized weakness;Acute pain;Altered mental status   PT Problem List Decreased strength;Decreased range of motion;Decreased activity tolerance;Decreased balance;Decreased mobility;Decreased cognition;Pain  PT Treatment Interventions Therapeutic exercise;Gait training;Cognitive remediation;Therapeutic activities;Functional mobility training;Patient/family education;Balance training   PT Goals (Current goals  can be found in the Care Plan section) Acute Rehab PT Goals Patient Stated Goal: none stated PT Goal Formulation: Patient unable to participate in goal setting Time For Goal Achievement: 02/03/16 Potential to Achieve Goals: Poor    Frequency Min 2X/week   Barriers to discharge        Co-evaluation               End of Session Equipment Utilized During Treatment: Gait belt Activity Tolerance: Patient limited by pain Patient left: in bed;with call bell/phone within reach;with bed alarm set Nurse Communication: Mobility status;Need for lift equipment    Functional Assessment Tool Used: clinical judgment Functional Limitation: Mobility: Walking and moving around Mobility: Walking and Moving Around Current Status [phone](G8978): 100 percent impaired, limited or restricted Mobility: Walking and Moving Around Goal Status [phone](G8979): At least 20 percent but less than 40 percent impaired, limited or restricted    Time: 0858-0921 PT Time Calculation (min) (ACUTE ONLY): 23 min   Charges:   PT Evaluation $PT Eval Low Complexity: 1 Procedure PT Treatments $Self Care/Home Management: 8-22   PT G Codes:   PT G-Codes **NOT FOR INPATIENT CLASS** Functional Assessment Tool Used: clinical judgment Functional Limitation: Mobility: Walking and moving around Mobility: Walking and Moving Around Current Status ([id number](G8978): 100 percent impaired, limited or restricted Mobility: Walking and Moving Around Goal Status ([id number](G8979): At least 20 percent but less than 40 percent impaired, limited or restricted    Rada HayHill, Kitai Purdom Elizabeth 01/20/2016, 12:32 PM Blanchard KelchKaren Khamari Sheehan PT [phone]

## 2016-01-20 NOTE — Progress Notes (Signed)
No changes since readmission.   Related encounter: ED to Hosp-Admission (Discharged) from 01/07/2016 in [facility] Work Assessment  Patient Details  Name: Elizabeth Booth MRN: [id number] Date of Birth: 1923-06-24  Date of referral: 01/09/16   Reason for consult: Discharge Planning     Permission sought to share information with: Facility Sport and exercise psychologist, Family Supports Permission granted to share information:: Yes, Release of Information Signed Name:: Naval architect:: SNFs Relationship:: Daughter Contact Information:    Housing/Transportation Living arrangements for the past 2 months: Apartment Source of Information: Patient Patient Interpreter Needed: None Criminal Activity/Legal Involvement Pertinent to Current Situation/Hospitalization: No - Comment as needed Significant Relationships: Adult Children Lives with: Adult Children Do you feel safe going back to the place where you live? Yes Need for family participation in patient care: No (Coment)  Care giving concerns: The patient is aggregable for short term rehab at discharge. Patient would like to rebuild her strength to return home.   Social Worker assessment / plan: CSW met with patient at beside to complete assessment. Patient was resting comfortably in bed. CSW explained PT recommendation for SNF placement. CSW explained SNF search and placement process to the patient and answered her questions. Patient reported her support as her daughter. Patient reported she trust her daughter and she believes she is a good caregiver. Per patient request CSW called patient's daughter, message left. CSW will follow up with bed offers.   Employment status: Retired Forensic scientist:   PT Recommendations: [place] / Referral to community resources: [place]  Patient/Family's Response to care: The patient appears happy with the care she is receiving in hospital and is appreciative of CSW assistance.  Patient/Family's Understanding of and Emotional Response to Diagnosis, Current Treatment, and Prognosis: The patient has a little understanding of why she was admitted. She has little understanding of care plan and what she will need post discharge.  Emotional Assessment Appearance: Appears stated age Attitude/Demeanor/Rapport:  (Patient was welcoming of CSW and appropriate.) Affect (typically observed): Accepting, Calm, Appropriate Orientation: Oriented to Self, Oriented to Place, Oriented to Time Alcohol / Substance use: Not Applicable Psych involvement (Current and /or in the community): No (Comment)  Discharge Needs  Concerns to be addressed: Discharge Planning Concerns Readmission within the last 30 days: No Current discharge risk: Lack of support system Barriers to Discharge: Continued Medical Work up   [employer], LCSW 01/09/2016, 4:21 PM     Kingsley Spittle, [place] Worker [phone]

## 2016-01-21 ENCOUNTER — Observation Stay (HOSPITAL_COMMUNITY): Payer: PPO

## 2016-01-21 DIAGNOSIS — M25561 Pain in right knee: Secondary | ICD-10-CM | POA: Diagnosis not present

## 2016-01-21 DIAGNOSIS — I2699 Other pulmonary embolism without acute cor pulmonale: Secondary | ICD-10-CM | POA: Diagnosis not present

## 2016-01-21 DIAGNOSIS — K921 Melena: Secondary | ICD-10-CM | POA: Diagnosis not present

## 2016-01-21 DIAGNOSIS — M25469 Effusion, unspecified knee: Secondary | ICD-10-CM | POA: Insufficient documentation

## 2016-01-21 DIAGNOSIS — D500 Iron deficiency anemia secondary to blood loss (chronic): Secondary | ICD-10-CM

## 2016-01-21 DIAGNOSIS — M25461 Effusion, right knee: Secondary | ICD-10-CM

## 2016-01-21 DIAGNOSIS — M25569 Pain in unspecified knee: Secondary | ICD-10-CM

## 2016-01-21 LAB — CBC
HCT: 25.9 % — ABNORMAL LOW (ref 36.0–46.0)
Hemoglobin: 8.3 g/dL — ABNORMAL LOW (ref 12.0–15.0)
MCH: 29.7 pg (ref 26.0–34.0)
MCHC: 32 g/dL (ref 30.0–36.0)
MCV: 92.8 fL (ref 78.0–100.0)
PLATELETS: 370 10*3/uL (ref 150–400)
RBC: 2.79 MIL/uL — ABNORMAL LOW (ref 3.87–5.11)
RDW: 14.3 % (ref 11.5–15.5)
WBC: 5 10*3/uL (ref 4.0–10.5)

## 2016-01-21 MED ORDER — ALPRAZOLAM 0.25 MG PO TABS: 0.2500 mg | ORAL_TABLET | Freq: Two times a day (BID) | ORAL | Status: AC

## 2016-01-21 MED ORDER — PRO-STAT SUGAR FREE PO LIQD: 30.0000 mL | Freq: Three times a day (TID) | ORAL | Status: AC

## 2016-01-21 NOTE — Consult Note (Signed)
WOC wound consult note Reason for Consult:Bilateral heel pressure injuries R>L Wound type:pressure Pressure Ulcer POA: Yes Measurement: Right heel(Stage 3): 1.2cm x 1cm x 0.2cm with red and yellow wound bed, scant serous exudate.  Left heel (Unstageable):  0.8cm round full thickness pressure injury with depth unable to be determined due to the presence of dried serum vs eschar. No exudate Wound bed:As described above Drainage (amount, consistency, odor) As described above Periwound: Intact, clear, dry Dressing procedure/placement/frequency: I have provided Nursing with guidence via the Orders for conservative topical care of the affected areas (white petrolatum gauze dressings changed daily) and for provision of pressure redistribution heel boots when in bed that she make take home with her when she returns to the facility. WOC nursing team will not follow, but will remain available to this patient, the nursing and medical teams.  Please re-consult if needed. Thanks, Ladona MowLaurie Shirl Weir, MSN, RN, GNP, Hans EdenCWOCN, CWON-AP, FAAN  Pager# [phone]

## 2016-01-21 NOTE — Discharge Summary (Signed)
Physician Discharge Summary  Bridgette HabermannHelen S Malkin RKY:[id number]:[id number] DOB: 11/04/1922 DOA: 01/18/2016  PCP: Lillia MountainGRIFFIN,JOHN JOSEPH, MD  Admit date: 01/18/2016 Discharge date: 01/21/2016  Admitted From: Skilled nursing facility Disposition:  Skilled nursing facility ([facility])  Recommendations for Outpatient Follow-up:  Follow up with M.D. at SNF in 1 week. Check H&H. Patient has been taken off anticoagulation indefinitely. Please have those of care discussion with patient's daughter (I was unable to reach her despite multiple attempts)     Discharge Condition: Guarded CODE STATUS: DO NOT INTUBATE Diet recommendation: Soft with thin liquid, nutritional supplement   Discharge Diagnoses:  Principal Problem:   Melena with unclear source of GI bleed   Active Problems:   Hypertension   Bilateral pulmonary embolism (HCC) with probable DVT resulting in knee pain   Urinary retention   Pelvic floor dysfunction   Dementia with behavioral disturbance   Pressure ulcer of both heels, unstageable (HCC), present on admission   Anemia due to blood loss     Brief/Interim Summary: Please refer to admission H&P for details, in brief, 82 y.o. female with advanced dementia, hypertension and urinary retention (has chronic Foley) with medical history significant advanced dementia sent to hospital for dark stools.  Patient was Admitted from 6/5-01/01/16 for possible melena. Her family declined EGD during that hospitalization, her hemoglobin stabilized and she was discharged back to home with her daughter, Jeronimo NormaJeanie. -She returned on 6/14-18/17 and was admitted for bilateral PE and probable DVT. She was started on a heparin drip and transitioned to Eliquis when her hemoglobin was reportedly stable. She was discharged this time [facility]. She then got readmitted for melena.Noted for dropping H&H. (Hemoglobin around 8).  Hospital course Melena with unclear source of GI bleed Family declined EGD  or colonoscopy during recent hospitalization. No further GI bleed documented. H&H has remained stable. She has been taken off anticoagulation indefinitely given her recurrent GI bleed. Previous hospitalist spoke with patient and daughter about overall poor prognosis and discharging her back to skilled nursing facility without anticoagulation. I was unable to reach patient's daughter despite multiple attempts to call her. Needs monitoring of her H&H in next 2-3 days at the facility.  Chronic bilateral ankle wound Evaluated by wound care who recommended conservative topical care the affected area with petroleum gauge dressing change daily and pressure redistribution his boots while in bed.  Protein calorie malnutrition, severe Increased pro-stat liquid to 3 times a day.  Right knee pain Appears to be associated with chronic osteoarthritis seen on x-ray. No hematoma noted.  Essential hypertension Stable. Continue neutropenic  Chronic anxiety Continue Xanax  Patient stable for discharge back to skilled nursing facility. Overall prognosis is guarded. Patient will be taken off anticoagulation indefinitely. I would recommend discussing further goals of care once daughter available and also referring to palliative care at the facility.     Discharge Instructions     Medication List    STOP taking these medications        apixaban 5 MG Tabs tablet  Commonly known as:  ELIQUIS      TAKE these medications        acetaminophen 325 MG tablet  Commonly known as:  TYLENOL  Take 2 tablets (650 mg total) by mouth every 6 (six) hours as needed for mild pain (or Fever >/= 101).     ALPRAZolam 0.25 MG tablet  Commonly known as:  XANAX  Take 1 tablet (0.25 mg total) by mouth 2 (two) times daily.  amLODipine 5 MG tablet  Commonly known as:  NORVASC  Take 1 tablet (5 mg total) by mouth daily.     DECUBI-VITE PO  Take 1 tablet by mouth daily.     feeding supplement (ENSURE ENLIVE)  Liqd  Take 237 mLs by mouth 3 (three) times daily between meals.     feeding supplement (PRO-STAT SUGAR FREE 64) Liqd  Take 30 mLs by mouth 3 (three) times daily with meals.     OLANZapine 5 MG tablet  Commonly known as:  ZYPREXA  Take 1 tablet (5 mg total) by mouth at bedtime.           Follow-up Information    Please follow up.   Why:  MD at SNF in 1 week     Allergies  Allergen Reactions  . Penicillins Anaphylaxis    Did PCN reaction causing immediate rash, facial/tongue/throat swelling, SOB or lightheadedness with hypotension: unknown Did PCN reaction causing severe rash involving mucus membranes or skin necrosis: unknown Did PCN reaction that required hospitalization: unknown Did PCN reaction occurring within the last 10 years: unknown If all of the above answers are "NO", then may proceed with Cephalosporin use. Pt is Unable to answer questions, and no family available to answer questions    Consultations: None  Procedures/Studies: Dg Chest 2 View  12/29/2015  CLINICAL DATA:  Hypoxia while sleeping and confusion EXAM: CHEST  2 VIEW COMPARISON:  None. FINDINGS: Cardiac shadow is within normal limits. Patchy interstitial changes are noted bilaterally without focal confluent infiltrate. No sizable effusion is seen. No acute bony abnormality noted. IMPRESSION: Patchy interstitial changes without acute abnormality. Electronically Signed   By: Alcide Clever M.D.   On: 12/29/2015 17:07   Dg Knee 1-2 Views Left  01/07/2016  CLINICAL DATA:  Pain EXAM: LEFT KNEE - 1-2 VIEW COMPARISON:  None. FINDINGS: Frontal and lateral views were obtained. There is no fracture or dislocation. There is a joint effusion. There is soft rather marked joint space narrowing medially. Other joint spaces appear normal. There are multiple foci of chondrocalcinosis. There is a small spur along the anterior superior patella. IMPRESSION: Osteoarthritic change with rather marked narrowing medially. Small joint  effusion. Chondrocalcinosis is present. Chondrocalcinosis may be seen with osteoarthritis but also may be indicative of calcium pyrophosphate deposition disease. Electronically Signed   By: Bretta Bang III M.D.   On: 01/07/2016 09:33   Dg Knee 1-2 Views Right  01/07/2016  CLINICAL DATA:  Knee pain bilaterally.  Dementia. EXAM: RIGHT KNEE - 1-2 VIEW COMPARISON:  12/29/2015 FINDINGS: Knee joint effusion is moderate and has decreased from prior. No fracture deformity or subluxation. Advanced knee osteoarthritis with near bone-on-bone contact at the medial compartment. Spurring is diffuse and bulky. Subchondral cyst at the tibial eminence. Nonspecific subcutaneous reticulation of the leg that is increased. Chondrocalcinosis and osteopenia. IMPRESSION: 1. No acute osseous finding. 2. Knee joint effusion that is decreased from 12/29/2015. 3. Increased but nonspecific leg edema. 4. Advanced tricompartmental osteoarthritis. Electronically Signed   By: Marnee Spring M.D.   On: 01/07/2016 09:32   Dg Ankle Complete Right  01/02/2016  CLINICAL DATA:  Right ankle swelling, pain. Possible fall last week. EXAM: RIGHT ANKLE - COMPLETE 3+ VIEW COMPARISON:  None. FINDINGS: Mild lateral soft tissue swelling. No underlying bony abnormality. No fracture, subluxation or dislocation. IMPRESSION: No acute bony abnormality. Electronically Signed   By: Charlett Nose M.D.   On: 01/02/2016 11:56   Ct Angio Chest Pe W/cm &/or  Wo Cm  01/07/2016  CLINICAL DATA:  Dementia and anxiety. Filling defect suggested in the right pulmonary artery on CT abdomen and pelvis. EXAM: CT ANGIOGRAPHY CHEST WITH CONTRAST TECHNIQUE: Multidetector CT imaging of the chest was performed using the standard protocol during bolus administration of intravenous contrast. Multiplanar CT image reconstructions and MIPs were obtained to evaluate the vascular anatomy. CONTRAST:  100 mL Isovue 370 COMPARISON:  CT abdomen and pelvis 01/07/2016 FINDINGS: Technically  adequate study with good opacification of the central and segmental pulmonary arteries. Multiple filling defects are demonstrated in the right main pulmonary artery and within bilateral upper lobe and lower lobe segmental branches. Appearance is consistent with acute pulmonary embolus. No evidence of right heart strain. Normal heart size. Normal caliber thoracic aorta. Aortic calcification. Great vessel origins are patent. No aortic dissection. Coronary artery calcifications. Esophagus is decompressed. Small esophageal hiatal hernia. No significant lymphadenopathy in the chest. Evaluation of lungs is limited due to motion artifact. Emphysematous changes in the lungs. Atelectasis in the lung bases. No pleural effusions. No pneumothorax. Included portions of the upper abdominal organs are grossly unremarkable. Degenerative changes in the spine. No destructive bone lesions. Review of the MIP images confirms the above findings. IMPRESSION: Examination is positive for multiple bilateral pulmonary emboli, including right lobar pulmonary artery embolus. No evidence of right heart strain. These results were called by telephone at the time of interpretation on 01/07/2016 at 7:02 am to PA. Misty Stanley , who verbally acknowledged these results. Electronically Signed   By: Burman Nieves M.D.   On: 01/07/2016 07:05   Ct Abdomen Pelvis W Contrast  01/07/2016  CLINICAL DATA:  Abdominal distention, black stools, diarrhea. Fall 2 weeks ago. EXAM: CT ABDOMEN AND PELVIS WITH CONTRAST TECHNIQUE: Multidetector CT imaging of the abdomen and pelvis was performed using the standard protocol following bolus administration of intravenous contrast. CONTRAST:  ISOVUE-300 IOPAMIDOL (ISOVUE-300) INJECTION 61% COMPARISON:  None. FINDINGS: Dependent atelectasis in the lung bases. Small esophageal hiatal hernia. Coronary artery calcifications. Pulmonary arteries are not entirely included within the field of view but there is suggestion of a  filling defect in the right main pulmonary artery. Pulmonary embolus is not excluded. The liver, spleen, pancreas, adrenal glands, inferior vena cava, and retroperitoneal lymph nodes are unremarkable. Calcification of aorta without aneurysm. Left renal hydronephrosis with ureterectasis. No stones are identified. Possibly due to reflux. Non radiopaque stone or stricture could also account for this appearance. Stomach, small bowel, and colon are not abnormally distended. Contrast material flows through to the rectum without evidence of bowel obstruction. Diverticulosis of the colon without inflammatory change. No free air or free fluid in the abdomen. Pelvis: The bladder is diffusely distended without wall thickening or filling defect. This may be physiologic or could indicate bowel obstruction or impaired bladder contraction. Uterus and ovaries are not enlarged. Calcifications in the uterus suggesting small fibroids. Appendix is not identified. Stool in the rectum with suggestion of pelvic floor descent. Contrast material in the gluteal crease likely due to incontinence. Degenerative changes in the spine. Old fractures of the left inferior pubic ramus. IMPRESSION: No evidence of bowel obstruction. Suggestion of pelvic floor descent and rectal incontinence. Diffusely fluid distended bladder. Left hydronephrosis and hydroureter. No stone identified. Possible reflux or obstruction due to an occult stone or stricture. Small esophageal hiatal hernia. Possible filling defect in the right main pulmonary artery could indicate pulmonary embolus. These results were called by telephone at the time of interpretation on 01/07/2016 at 3:38 am  to Dr. Rochele Raring , who verbally acknowledged these results. Electronically Signed   By: Burman Nieves M.D.   On: 01/07/2016 03:44   Dg Knee Complete 4 Views Right  12/29/2015  CLINICAL DATA:  Fall, confusion.  Right leg pain. EXAM: RIGHT KNEE - COMPLETE 4+ VIEW COMPARISON:  None.  FINDINGS: There is chondrocalcinosis and moderate degenerative changes throughout the right knee. Moderate joint effusion. No acute bony abnormality. Specifically, no fracture, subluxation, or dislocation. Soft tissues are intact. IMPRESSION: Moderate degenerative changes with chondrocalcinosis. Moderate joint effusion. No acute bony abnormality. Electronically Signed   By: Charlett Nose M.D.   On: 12/29/2015 13:34   Dg Hip Unilat With Pelvis 2-3 Views Right  12/29/2015  CLINICAL DATA:  Status post fall.  Right leg pain. EXAM: DG HIP (WITH OR WITHOUT PELVIS) 2-3V RIGHT COMPARISON:  None. FINDINGS: No acute fracture or dislocation. No lytic or sclerotic osseous lesion. Mild osteoarthritis of the right hip. Mild degenerative changes of the SI joints and lower lumbar spine. IMPRESSION: No acute osseous injury of the right hip. Electronically Signed   By: Elige Ko   On: 12/29/2015 13:35   Dg Knee 2 Views Right  01/21/2016  CLINICAL DATA:  Right lower extremity pain, history of DVT EXAM: RIGHT KNEE - 2 VIEW COMPARISON:  01/07/2016 FINDINGS: Two views of the right knee submitted. Again noted significant narrowing of medial joint compartment. Chondrocalcinosis noted. Sclerotic changes noted medial tibial plateau. There is spurring of medial and lateral femoral condyle. Significant narrowing of patellofemoral joint space. Moderate joint effusion. Spurring of patella. No acute fracture or subluxation. IMPRESSION: No acute fracture or subluxation. Chondrocalcinosis. Extensive osteoarthritic changes as described above. Moderate joint effusion. Electronically Signed   By: Natasha Mead M.D.   On: 01/21/2016 10:47    (Echo, Carotid, EGD, Colonoscopy, ERCP)    Subjective:   Discharge Exam: Filed Vitals:   01/21/16 0543 01/21/16 1340  BP: 113/63 115/60  Pulse: 88 85  Temp: 97.7 F (36.5 C) 97.1 F (36.2 C)  Resp: 19 18   Filed Vitals:   01/20/16 1406 01/20/16 2130 01/21/16 0543 01/21/16 1340  BP: 113/49  129/57 113/63 115/60  Pulse: 86 89 88 85  Temp: 97.7 F (36.5 C) 97.4 F (36.3 C) 97.7 F (36.5 C) 97.1 F (36.2 C)  TempSrc: Oral Axillary Oral Oral  Resp: 20 20 19 18   Height:      Weight:      SpO2: 94% 92% 98% 97%    General: Elderly thin built female lying in bed not in distress, confused HEENT: Pallor present, moist mucosa, supple neck Cardiovascular: RRR, S1/S2 +, no rubs, no gallops Respiratory: CTA bilaterally, no wheezing, no rhonchi Abdominal: Soft, NT, ND, bowel sounds + Extremities: Warm, no edema, mild swelling of the right knee with tender are I am CNS: AAO x0, poor comprehension    The results of significant diagnostics from this hospitalization (including imaging, microbiology, ancillary and laboratory) are listed below for reference.     Microbiology: Recent Results (from the past 240 hour(s))  Urine culture     Status: None   Collection Time: 01/16/16 11:33 AM  Result Value Ref Range Status   Specimen Description URINE, CATHETERIZED  Final   Special Requests NONE  Final   Culture NO GROWTH Performed at [facility]   Final   Report Status 01/17/2016 FINAL  Final  MRSA PCR Screening     Status: None   Collection Time: 01/18/16 11:55 PM  Result Value Ref Range Status   MRSA by PCR NEGATIVE NEGATIVE Final    Comment:        The GeneXpert MRSA Assay (FDA approved for NASAL specimens only), is one component of a comprehensive MRSA colonization surveillance program. It is not intended to diagnose MRSA infection nor to guide or monitor treatment for MRSA infections.      Labs: BNP (last 3 results) No results for input(s): BNP in the last 8760 hours. Basic Metabolic Panel:  Recent Labs Lab 01/16/16 1144 01/18/16 1937 01/19/16 0512 01/20/16 0459  NA 140 140 141 139  K 3.6 3.5 3.3* 3.5  CL 104 103 105 106  CO2 30 31 30 29   GLUCOSE 98 98 126* 112*  BUN 19 16 16 15   CREATININE 0.55 0.62 0.56 0.44  CALCIUM 8.4* 8.3* 8.0* 8.1*    Liver Function Tests:  Recent Labs Lab 01/16/16 1144  AST 20  ALT 13*  ALKPHOS 67  BILITOT 0.8  PROT 5.6*  ALBUMIN 2.3*   No results for input(s): LIPASE, AMYLASE in the last 168 hours. No results for input(s): AMMONIA in the last 168 hours. CBC:  Recent Labs Lab 01/16/16 1144 01/18/16 1937  01/19/16 1732 01/19/16 2245 01/20/16 0459 01/20/16 1702 01/21/16 0329  WBC 7.0 7.0  < > 4.9 4.8 5.1 5.9 5.0  NEUTROABS 5.0 4.8  --   --   --   --   --   --   HGB 9.5* 8.9*  < > 8.0* 7.7* 8.3* 8.5* 8.3*  HCT 29.4* 28.1*  < > 25.4* 24.3* 25.9* 27.2* 25.9*  MCV 91.9 94.3  < > 94.4 94.6 94.2 94.1 92.8  PLT 374 351  < > 329 303 354 377 370  < > = values in this interval not displayed. Cardiac Enzymes: No results for input(s): CKTOTAL, CKMB, CKMBINDEX, TROPONINI in the last 168 hours. BNP: Invalid input(s): POCBNP CBG: No results for input(s): GLUCAP in the last 168 hours. D-Dimer No results for input(s): DDIMER in the last 72 hours. Hgb A1c No results for input(s): HGBA1C in the last 72 hours. Lipid Profile No results for input(s): CHOL, HDL, LDLCALC, TRIG, CHOLHDL, LDLDIRECT in the last 72 hours. Thyroid function studies No results for input(s): TSH, T4TOTAL, T3FREE, THYROIDAB in the last 72 hours.  Invalid input(s): FREET3 Anemia work up No results for input(s): VITAMINB12, FOLATE, FERRITIN, TIBC, IRON, RETICCTPCT in the last 72 hours. Urinalysis    Component Value Date/Time   COLORURINE RED* 01/16/2016 1133   APPEARANCEUR CLOUDY* 01/16/2016 1133   LABSPEC 1.018 01/16/2016 1133   PHURINE 7.5 01/16/2016 1133   GLUCOSEU NEGATIVE 01/16/2016 1133   HGBUR LARGE* 01/16/2016 1133   BILIRUBINUR NEGATIVE 01/16/2016 1133   KETONESUR NEGATIVE 01/16/2016 1133   PROTEINUR 30* 01/16/2016 1133   NITRITE NEGATIVE 01/16/2016 1133   LEUKOCYTESUR SMALL* 01/16/2016 1133   Sepsis Labs Invalid input(s): PROCALCITONIN,  WBC,  LACTICIDVEN Microbiology Recent Results (from the past 240  hour(s))  Urine culture     Status: None   Collection Time: 01/16/16 11:33 AM  Result Value Ref Range Status   Specimen Description URINE, CATHETERIZED  Final   Special Requests NONE  Final   Culture NO GROWTH [facility]   Final   Report Status 01/17/2016 FINAL  Final  MRSA PCR Screening     Status: None   Collection Time: 01/18/16 11:55 PM  Result Value Ref Range Status   MRSA by PCR NEGATIVE NEGATIVE Final  Comment:        The GeneXpert MRSA Assay (FDA approved for NASAL specimens only), is one component of a comprehensive MRSA colonization surveillance program. It is not intended to diagnose MRSA infection nor to guide or monitor treatment for MRSA infections.      Time coordinating discharge: 25 minutes  SIGNED:   Eddie North, MD  [facility] 01/21/2016, 1:44 PM Pager   If 7PM-7AM, please contact night-coverage [email or web address] Password [email or web address]

## 2016-01-21 NOTE — Care Management Note (Signed)
Case Management Note  Patient Details  Name: Elizabeth HabermannHelen S Booth MRN: [id number] Date of Birth: 04/02/1923  Subjective/Objective: 82 yo admitted with Melena                   Action/Plan: [facility] and to dc back there.  Expected Discharge Date:                  Expected Discharge Plan:  Skilled Nursing Facility  In-House Referral:  Clinical Social Work  Discharge planning Services  CM Consult  Post Acute Care Choice:    Choice offered to:     DME Arranged:    DME Agency:     HH Arranged:    HH Agency:     Status of Service:  Completed, signed off  If discussed at [employer] Meetings, dates discussed:    Additional CommentsBartholome Bill:  Mckenna Gamm H, RN 01/21/2016, 11:02 AM  [phone]

## 2016-01-21 NOTE — Progress Notes (Signed)
Patient is set to [facility] today. Patient & daughter aware. Discharge packet given to RN, Hilton CorkEkua. [facility] called for transport.   Stacy GardnerErin Sundra Haddix, LCSWA Clinical Social Worker [phone]

## 2016-01-21 NOTE — Clinical Social Work Placement (Signed)
   CLINICAL SOCIAL WORK PLACEMENT  NOTE  Date:  01/21/2016  Patient Details  Name: Elizabeth HabermannHelen S Booth MRN: [id number] Date of Birth: 08/13/1922  Clinical Social Work is seeking post-discharge placement for this patient at the Skilled  Nursing Facility level of care (*CSW will initial, date and re-position this form in  chart as items are completed):  Yes   Patient/family provided with [place] Clinical Social Work Department's list of facilities offering this level of care within the geographic area requested by the patient (or if unable, by the patient's family).  Yes   Patient/family informed of their freedom to choose among providers that offer the needed level of care, that participate in Medicare, Medicaid or managed care program needed by the patient, have an available bed and are willing to accept the patient.  Yes   Patient/family informed of [facility]'s ownership interest in [facility] and [facility], as well as of the fact that they are under no obligation to receive care at these facilities.  PASRR submitted to EDS on       PASRR number received on       Existing PASRR number confirmed on 01/21/16     FL2 transmitted to all facilities in geographic area requested by pt/family on       FL2 transmitted to all facilities within larger geographic area on       Patient informed that his/her managed care company has contracts with or will negotiate with certain facilities, including the following:        Yes   Patient/family informed of bed offers received.  [facility]     Physician recommends and patient chooses bed at      [facility] on 01/21/16.  Patient to be transferred to facility by [facility]     Patient family notified on 01/21/16 of transfer.  Name of family member notified:  Jeronimo NormaJeanie- Daughter      PHYSICIAN       Additional Comment:     _______________________________________________ Donnie CoffinErin M Ryeleigh Santore, LCSW 01/21/2016, 2:37 PM

## 2016-01-22 LAB — TYPE AND SCREEN
ABO/RH(D): O POS
ANTIBODY SCREEN: NEGATIVE
Unit division: 0
Unit division: 0

## 2016-01-28 ENCOUNTER — Encounter (INDEPENDENT_AMBULATORY_CARE_PROVIDER_SITE_OTHER): Payer: PPO | Admitting: Podiatry

## 2016-01-28 NOTE — Progress Notes (Signed)
This encounter was created in error - please disregard.

## 2016-01-29 NOTE — ED Provider Notes (Signed)
CSN: [id number]     Arrival date & time 01/18/16  1854 History   First MD Initiated Contact with Patient 01/18/16 1921     Chief Complaint  Patient presents with  . Melena    PER EMS THIS IS CHRONIC     (Consider location/radiation/quality/duration/timing/severity/associated sxs/prior Treatment) Patient is a 82 y.o. female presenting with GI illness. The history is provided by the EMS personnel and the nursing home.  GI Problem This is a recurrent problem. The current episode started 12 to 24 hours ago. The problem occurs constantly. The problem has not changed since onset.Associated symptoms comments: Dark stools. Nothing aggravates the symptoms. Nothing relieves the symptoms. She has tried nothing for the symptoms.    Past Medical History  Diagnosis Date  . Hypertension   . History of hiatal hernia   . Pulmonary embolism, bilateral (HCC) 01/07/2016  . GERD (gastroesophageal reflux disease)   . Headache     "maybe twice/wk" (01/09/2016)  . Dementia     "in the last year" (01/09/2016)  . Depression   . Bilateral pulmonary embolism (HCC) with probable DVT resulting in knee pain 01/07/2016  . Dementia with behavioral disturbance 01/07/2016  . Pelvic floor dysfunction 01/07/2016  . Pressure ulcer of both heels, unstageable (HCC), present on admission 01/09/2016  . Protein-calorie malnutrition, severe 01/08/2016  . Urinary retention 01/07/2016   Past Surgical History  Procedure Laterality Date  . Cholecystectomy open  1953  . Appendectomy  1945  . Cataract extraction      "? side"   Family History  Problem Relation Age of Onset  . Stroke Mother   . Stroke Father    Social History  Substance Use Topics  . Smoking status: Never Smoker   . Smokeless tobacco: Never Used  . Alcohol Use: No   OB History    No data available     Review of Systems  All other systems reviewed and are negative.   Level 5 exception to history: dementia   Allergies  Penicillins  Home  Medications   Prior to Admission medications   Medication Sig Start Date End Date Taking? Authorizing Provider  acetaminophen (TYLENOL) 325 MG tablet Take 2 tablets (650 mg total) by mouth every 6 (six) hours as needed for mild pain (or Fever >/= 101). 01/11/16  Yes Christina P Rama, MD  amLODipine (NORVASC) 5 MG tablet Take 1 tablet (5 mg total) by mouth daily. 01/01/16  Yes Kathlen ModyVijaya Akula, MD  feeding supplement, ENSURE ENLIVE, (ENSURE ENLIVE) LIQD Take 237 mLs by mouth 3 (three) times daily between meals. 01/11/16  Yes Maryruth Bunhristina P Rama, MD  Multiple Vitamins-Minerals (DECUBI-VITE PO) Take 1 tablet by mouth daily.   Yes Historical Provider, MD  OLANZapine (ZYPREXA) 5 MG tablet Take 1 tablet (5 mg total) by mouth at bedtime. Patient taking differently: Take 5 mg by mouth daily.  01/11/16  Yes Maryruth Bunhristina P Rama, MD  ALPRAZolam (XANAX) 0.25 MG tablet Take 1 tablet (0.25 mg total) by mouth 2 (two) times daily. 01/21/16   Nishant Dhungel, MD  Amino Acids-Protein Hydrolys (FEEDING SUPPLEMENT, PRO-STAT SUGAR FREE 64,) LIQD Take 30 mLs by mouth 3 (three) times daily with meals. 01/21/16   Nishant Dhungel, MD   BP 115/60 mmHg  Pulse 85  Temp(Src) 97.1 F (36.2 C) (Oral)  Resp 18  Ht 5\' 4"  (1.626 m)  Wt 130 lb (58.968 kg)  BMI 22.30 kg/m2  SpO2 97% Physical Exam  Constitutional: She appears well-developed and well-nourished. No distress.  HENT:  Head: Normocephalic.  Eyes: Conjunctivae are normal.  Neck: Neck supple. No tracheal deviation present.  Cardiovascular: Normal rate and regular rhythm.   Pulmonary/Chest: Effort normal. No respiratory distress.  Abdominal: Soft. She exhibits no distension. There is no tenderness.  Genitourinary: Guaiac positive stool.  Neurological: She is alert. She is disoriented.  Skin: Skin is warm and dry.  Psychiatric: She has a normal mood and affect.    ED Course  Procedures (including critical care time) Labs Review Labs Reviewed  CBC WITH  DIFFERENTIAL/PLATELET - Abnormal; Notable for the following:    RBC 2.98 (*)    Hemoglobin 8.9 (*)    HCT 28.1 (*)    All other components within normal limits  BASIC METABOLIC PANEL - Abnormal; Notable for the following:    Calcium 8.3 (*)    All other components within normal limits  CBC - Abnormal; Notable for the following:    RBC 3.02 (*)    Hemoglobin 9.1 (*)    HCT 28.3 (*)    All other components within normal limits  CBC - Abnormal; Notable for the following:    RBC 2.80 (*)    Hemoglobin 8.5 (*)    HCT 26.4 (*)    All other components within normal limits  CBC - Abnormal; Notable for the following:    RBC 2.64 (*)    Hemoglobin 7.9 (*)    HCT 24.4 (*)    All other components within normal limits  BASIC METABOLIC PANEL - Abnormal; Notable for the following:    Potassium 3.3 (*)    Glucose, Bld 126 (*)    Calcium 8.0 (*)    All other components within normal limits  CBC - Abnormal; Notable for the following:    RBC 2.69 (*)    Hemoglobin 8.0 (*)    HCT 25.4 (*)    All other components within normal limits  CBC - Abnormal; Notable for the following:    RBC 2.57 (*)    Hemoglobin 7.7 (*)    HCT 24.3 (*)    All other components within normal limits  CBC - Abnormal; Notable for the following:    RBC 2.75 (*)    Hemoglobin 8.3 (*)    HCT 25.9 (*)    All other components within normal limits  BASIC METABOLIC PANEL - Abnormal; Notable for the following:    Glucose, Bld 112 (*)    Calcium 8.1 (*)    Anion gap 4 (*)    All other components within normal limits  CBC - Abnormal; Notable for the following:    RBC 2.89 (*)    Hemoglobin 8.5 (*)    HCT 27.2 (*)    All other components within normal limits  CBC - Abnormal; Notable for the following:    RBC 2.79 (*)    Hemoglobin 8.3 (*)    HCT 25.9 (*)    All other components within normal limits  POC OCCULT BLOOD, ED - Abnormal; Notable for the following:    Fecal Occult Bld POSITIVE (*)    All other components  within normal limits  MRSA PCR SCREENING  TYPE AND SCREEN  ABO/RH  PREPARE RBC (CROSSMATCH)    Imaging Review No results found. I have personally reviewed and evaluated these images and lab results as part of my medical decision-making.   EKG Interpretation None      MDM   Final diagnoses:  Melena  Blood loss anemia  Anticoagulated by anticoagulation  treatment    82 y.o. female presents with dark stools in setting of anticoagulation for PE. Difficult history 2/2 dementia and unable to get in touch with family. Drop in Hb from baseline suggestive of GIB. HDS with no acute complaints currently. Hospitalist was consulted for admission and will see the patient in the emergency department.    Lyndal Pulleyaniel Jedediah Noda, MD 01/29/16 [phone]

## 2016-02-04 NOTE — Progress Notes (Signed)
Occupational Therapy Evaluation Addendum:    01/09/16 1400  OT G-codes **NOT FOR INPATIENT CLASS**  Functional Limitation Self care  Self Care Current Status (Z6109(G8987) CM  Self Care Goal Status (U0454(G8988) CL  Jeani HawkingWendi Auren Valdes, OTR/L [phone]

## 2016-02-04 NOTE — Progress Notes (Addendum)
Late entry for missed G-code. Based on review of the evaluation and goals by Tawni Millersawn White, PT.   01/09/16 1501  PT G-Codes **NOT FOR INPATIENT CLASS**  Functional Assessment Tool Used clinical judgement based on review of medical record  Functional Limitation Mobility: Walking and moving around  Mobility: Walking and Moving Around Current Status (Z6109(G8978) CN  Mobility: Walking and Moving Around Goal Status [phone]([phone]) Cristy FolksJ  Shonte Beutler, PT  [phone] 02/04/2016

## 2016-02-05 NOTE — Progress Notes (Signed)
   01/09/16 0800  SLP Visit Information  SLP Received On 01/09/16  General Information  Date of Onset 01/07/16  HPI [age] y.o. female with medical history significant of hypertension and severe dementia. Of note patient was discharged from hospital on 12/29/2015 after admission for possible GI bleed, hypotension and mechanical fall.   Type of Study Bedside Swallow Evaluation  Previous Swallow Assessment n/a  Diet Prior to this Study Regular;Thin liquids  Temperature Spikes Noted No  Respiratory Status Room air  History of Recent Intubation No  Behavior/Cognition Alert;Cooperative;Confused  Oral Cavity Assessment WFL  Oral Care Completed by SLP No  Oral Cavity - Dentition Missing dentition  Vision Functional for self-feeding  Self-Feeding Abilities Needs set up  Patient Positioning Upright in bed  Baseline Vocal Quality Normal  Volitional Cough Strong  Volitional Swallow Able to elicit  Oral Assessment (Complete on admission/transfer/change in patient condition)  Does patient have any of the following "high risk" factors? None of the above  Does patient have any of the following "at risk" factors? None of the above  Patient is LOW RISK Follow universal precautions (see row information)  Oral Motor/Sensory Function  Overall Oral Motor/Sensory Function WFL  Ice Chips  Ice chips NT  Thin Liquid  Thin Liquid WFL  Presentation Straw  Nectar Thick Liquid  Nectar Thick Liquid NT  Honey Thick Liquid  Honey Thick Liquid NT  Puree  Puree NT  Solid  Solid WFL  Presentation Self Fed  Other Comments (Pt finishing her breakfast tray; refused puree)  SLP - End of Session  Patient left in bed;with call bell/phone within reach  Nurse Communication Diet recommendation  SLP Assessment  Impact on safety and function Mild aspiration risk  Other Related Risk Factors Cognitive impairment  Swallow Evaluation Recommendations  Medication Administration Other (Comment) (as tolerated;  whole/liquid)  Treatment Plan  Oral Care Recommendations Oral care BID  Treatment Recommendations No treatment recommended at this time  Follow up Recommendations None  Prognosis  Prognosis for Safe Diet Advancement Good  Individuals Consulted  Consulted and Agree with Results and Recommendations Patient;Patient unable/family or caregiver not available;RN  SLP Time Calculation  SLP Start Time (ACUTE ONLY) 0830  SLP Stop Time (ACUTE ONLY) 0847  SLP Time Calculation (min) (ACUTE ONLY) 17 min  SLP G-Codes **NOT FOR INPATIENT CLASS**  Functional Assessment Tool Used skilled clinical judgement via chart review  Functional Limitations Swallowing  Swallow Current Status (Z6109(G8996) [facility]  [facility]8997[facility]  Swallow Discharge Status (U9811(G8998) [facility]  SLP Evaluations  $ SLP Speech Visit 1 Procedure  SLP Evaluations  $BSS Swallow 1 Procedure  Late entry for missed G-code. Based on review of the evaluation and goals by Tressie StalkerPat Adams.   Ferdinand LangoLeah Sathvik Tiedt MA, CCC-SLP [phone]

## 2016-04-25 DEATH — deceased

## 2017-09-01 IMAGING — CT CT ABD-PELV W/ CM
2 of 5 series · 11 of 46 positions shown, 12 images · IV contrast (Iodine)
Comparison: None.

CLINICAL DATA: Abdominal distention, black stools, diarrhea. Fall 2
weeks ago.

EXAM:
CT ABDOMEN AND PELVIS WITH CONTRAST
TECHNIQUE: Multidetector CT imaging of the abdomen and pelvis was performed
using the standard protocol following bolus administration of
intravenous contrast.
CONTRAST:  100mL 334VP2-B00 IOPAMIDOL (334VP2-B00) INJECTION 61%

[Series 201: routine, idose (2) · axial · 0.69mm/px · z∈[+90,+450]mm · 8 of 92 slices shown, 9 images]
[im 10/92  soft-tissue]
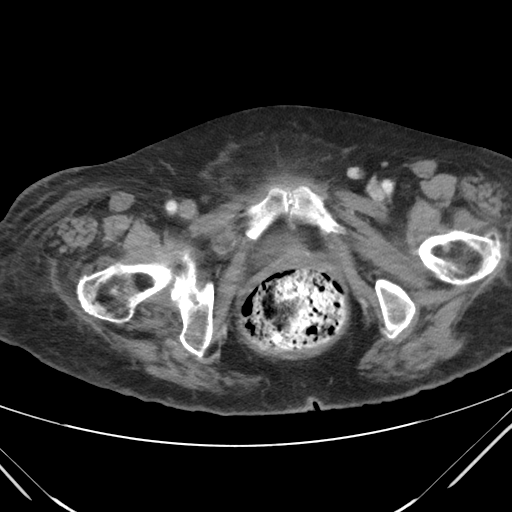
[im 10/92  bone]
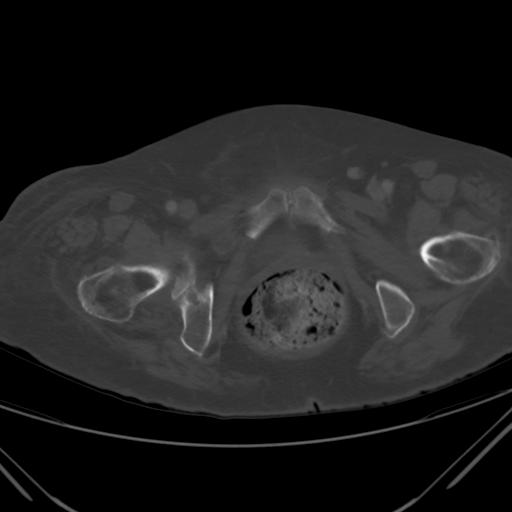
[im 20/92  soft-tissue]
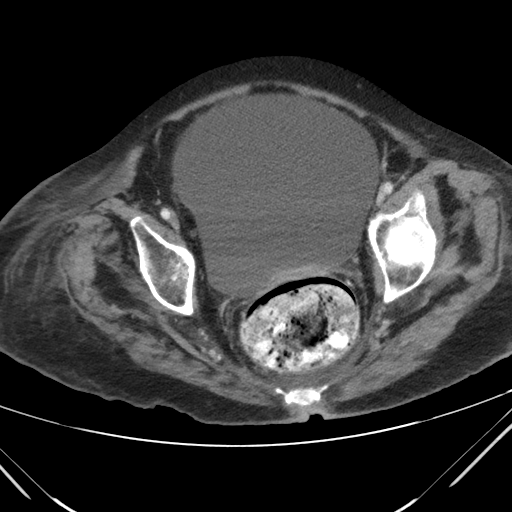
[im 29/92  soft-tissue]
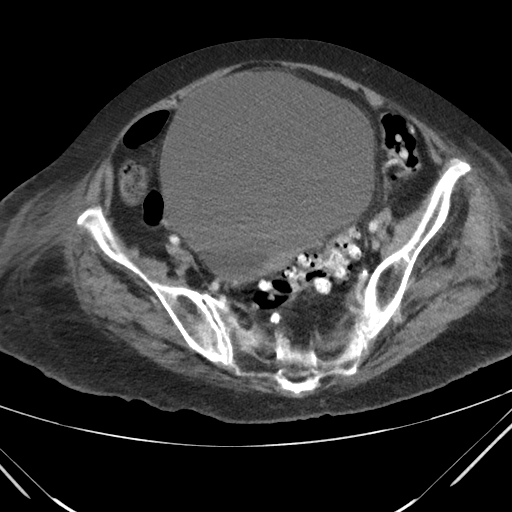
[im 39/92  soft-tissue]
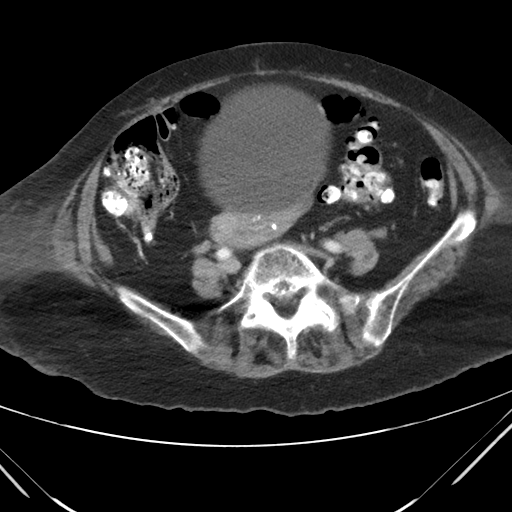
[im 53/92  soft-tissue]
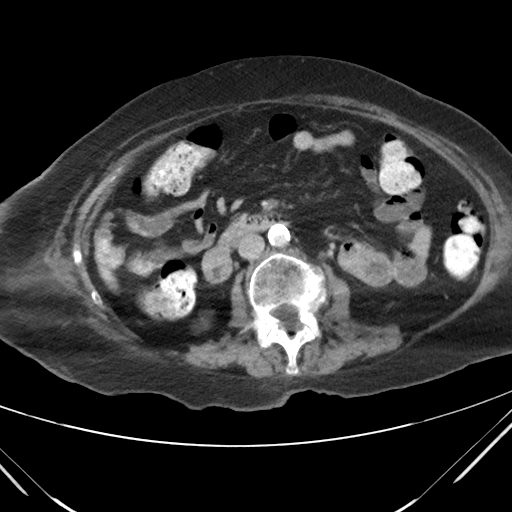
[im 63/92  soft-tissue]
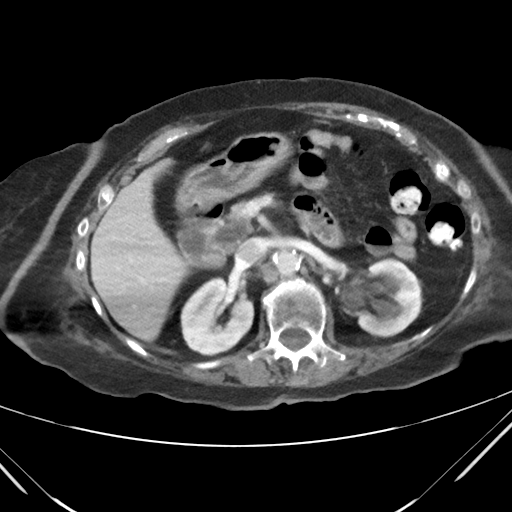
[im 72/92  soft-tissue]
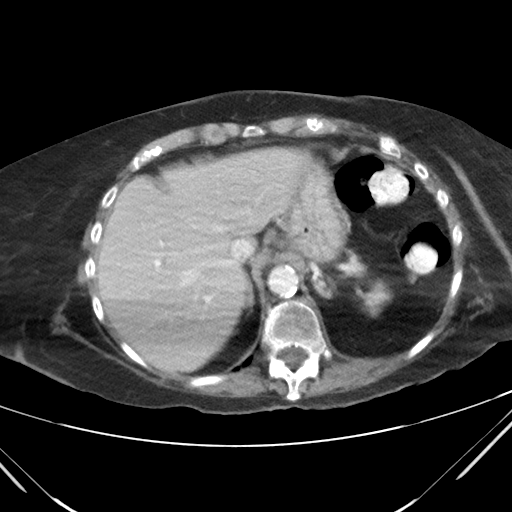
[im 82/92  soft-tissue]
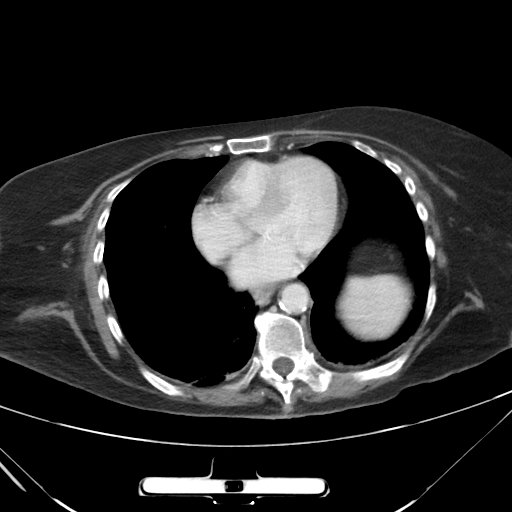

[Series 203: coronals, idose (2) · coronal · 0.45mm/px · 3 of 116 slices shown]
[im 39/116  soft-tissue]
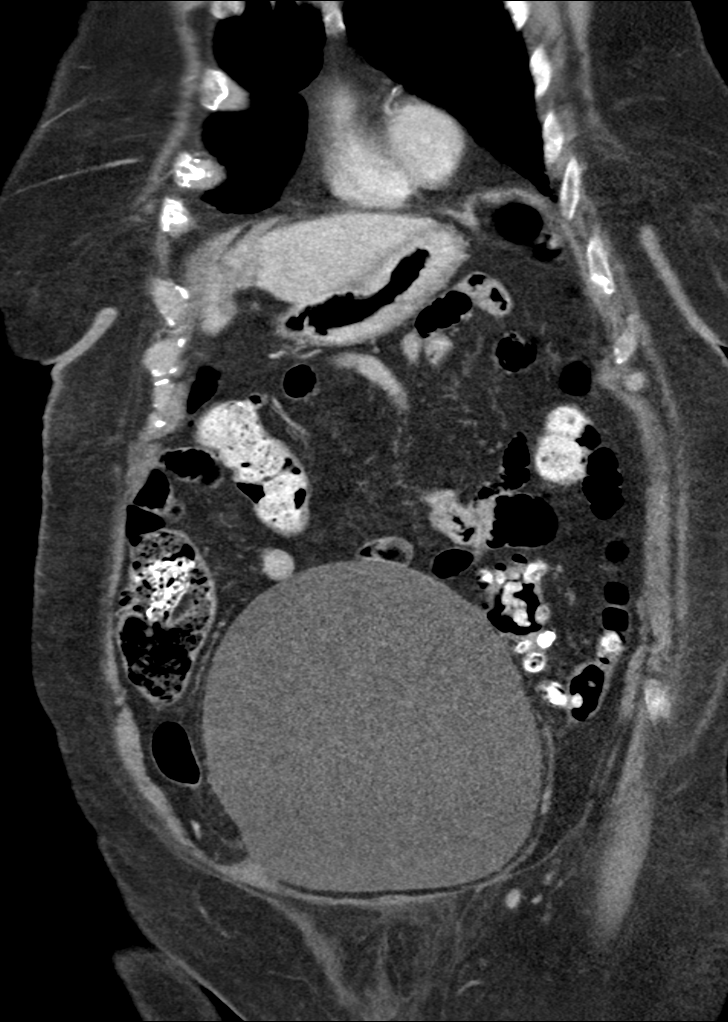
[im 52/116  soft-tissue]
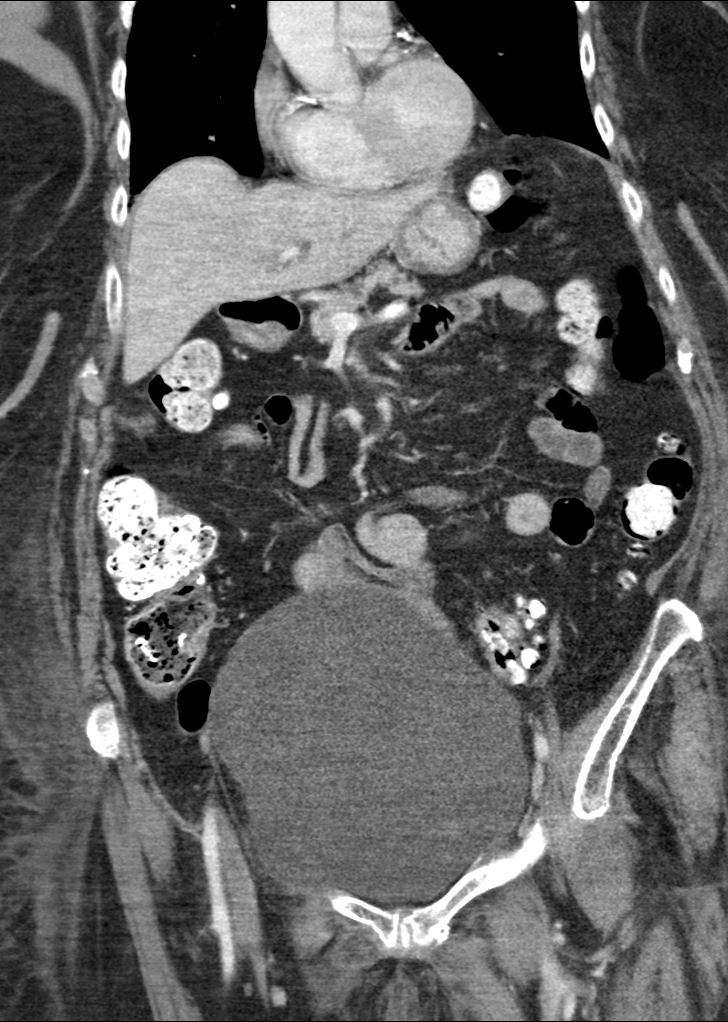
[im 64/116  soft-tissue]
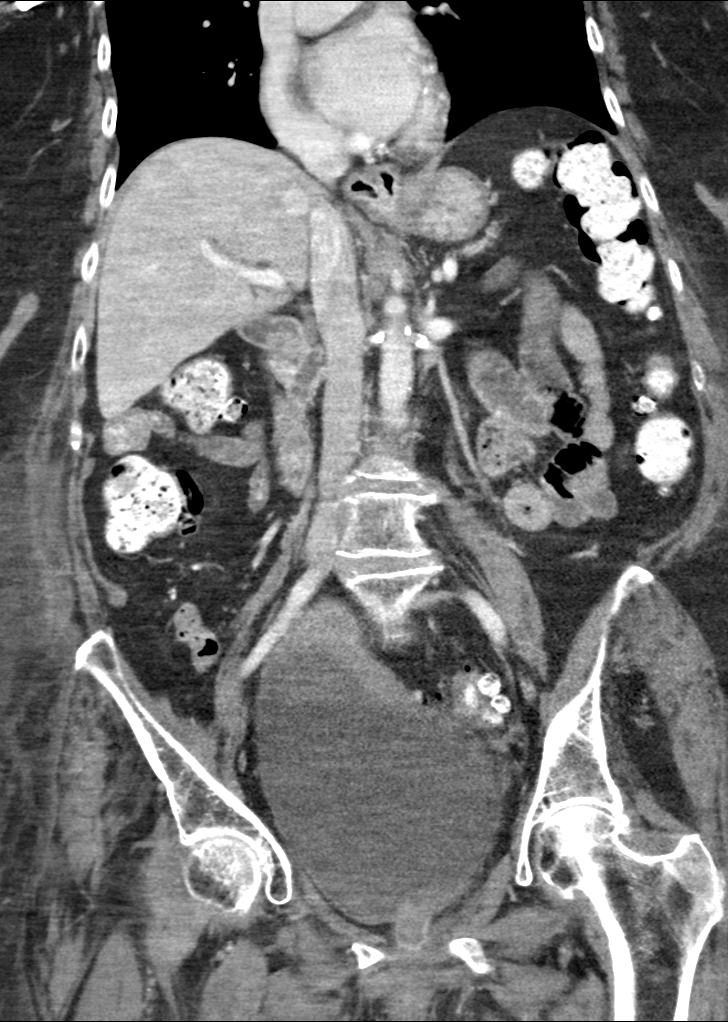

[11 of 46 positions shown; findings below may reference images not displayed]

FINDINGS: Dependent atelectasis in the lung bases. Small esophageal hiatal
hernia. Coronary artery calcifications. Pulmonary arteries are not
entirely included within the field of view but there is suggestion
of a filling defect in the right main pulmonary artery. Pulmonary
embolus is not excluded.

The liver, spleen, pancreas, adrenal glands, inferior vena cava, and
retroperitoneal lymph nodes are unremarkable. Calcification of aorta
without aneurysm. Left renal hydronephrosis with ureterectasis. No
stones are identified. Possibly due to reflux. Non radiopaque stone
or stricture could also account for this appearance. Stomach, small
bowel, and colon are not abnormally distended. Contrast material
flows through to the rectum without evidence of bowel obstruction.
Diverticulosis of the colon without inflammatory change. No free air
or free fluid in the abdomen.

Pelvis: The bladder is diffusely distended without wall thickening
or filling defect. This may be physiologic or could indicate bowel
obstruction or impaired bladder contraction. Uterus and ovaries are
not enlarged. Calcifications in the uterus suggesting small
fibroids. Appendix is not identified. Stool in the rectum with
suggestion of pelvic floor descent. Contrast material in the gluteal
crease likely due to incontinence. Degenerative changes in the
spine. Old fractures of the left inferior pubic ramus.
IMPRESSION: No evidence of bowel obstruction. Suggestion of pelvic floor descent
and rectal incontinence. Diffusely fluid distended bladder. Left
hydronephrosis and hydroureter. No stone identified. Possible reflux
or obstruction due to an occult stone or stricture. Small esophageal
hiatal hernia. Possible filling defect in the right main pulmonary
artery could indicate pulmonary embolus.

These results were called by telephone at the time of interpretation
on 01/07/2016 at [DATE] to Dr. FARIHA CHARPENTIER , who verbally
acknowledged these results.
